# Patient Record
Sex: Male | Born: 1950 | Race: White | Hispanic: No | Marital: Married | Smoking: Former smoker
Health system: Southern US, Community
[De-identification: ages and names within clinical notes are randomized; demographics above are authoritative.]

## PROBLEM LIST (undated history)

## (undated) DIAGNOSIS — N400 Enlarged prostate without lower urinary tract symptoms: Secondary | ICD-10-CM

## (undated) DIAGNOSIS — I10 Essential (primary) hypertension: Secondary | ICD-10-CM

## (undated) HISTORY — PX: CHOLECYSTECTOMY: SHX55

---

## 2002-07-05 ENCOUNTER — Encounter: Admission: RE | Admit: 2002-07-05 | Discharge: 2002-07-05 | Payer: Self-pay | Admitting: Family Medicine

## 2002-07-05 ENCOUNTER — Encounter: Payer: Self-pay | Admitting: Family Medicine

## 2002-10-06 ENCOUNTER — Ambulatory Visit (HOSPITAL_COMMUNITY): Admission: RE | Admit: 2002-10-06 | Discharge: 2002-10-06 | Payer: Self-pay | Admitting: Gastroenterology

## 2014-03-24 ENCOUNTER — Inpatient Hospital Stay (HOSPITAL_BASED_OUTPATIENT_CLINIC_OR_DEPARTMENT_OTHER)
Admission: EM | Admit: 2014-03-24 | Discharge: 2014-03-26 | DRG: 603 | Disposition: A | Discharge status: Home / Self Care | Payer: BLUE CROSS/BLUE SHIELD | Attending: Internal Medicine | Admitting: Internal Medicine

## 2014-03-24 ENCOUNTER — Emergency Department (HOSPITAL_BASED_OUTPATIENT_CLINIC_OR_DEPARTMENT_OTHER): Payer: BLUE CROSS/BLUE SHIELD

## 2014-03-24 ENCOUNTER — Encounter (HOSPITAL_BASED_OUTPATIENT_CLINIC_OR_DEPARTMENT_OTHER): Payer: Self-pay | Admitting: *Deleted

## 2014-03-24 DIAGNOSIS — W5501XD Bitten by cat, subsequent encounter: Secondary | ICD-10-CM

## 2014-03-24 DIAGNOSIS — Z203 Contact with and (suspected) exposure to rabies: Secondary | ICD-10-CM | POA: Diagnosis present

## 2014-03-24 DIAGNOSIS — L03113 Cellulitis of right upper limb: Secondary | ICD-10-CM

## 2014-03-24 DIAGNOSIS — I10 Essential (primary) hypertension: Secondary | ICD-10-CM | POA: Diagnosis present

## 2014-03-24 DIAGNOSIS — Z79899 Other long term (current) drug therapy: Secondary | ICD-10-CM

## 2014-03-24 DIAGNOSIS — I891 Lymphangitis: Secondary | ICD-10-CM

## 2014-03-24 DIAGNOSIS — S61459A Open bite of unspecified hand, initial encounter: Secondary | ICD-10-CM | POA: Diagnosis present

## 2014-03-24 DIAGNOSIS — W5501XA Bitten by cat, initial encounter: Secondary | ICD-10-CM | POA: Diagnosis present

## 2014-03-24 DIAGNOSIS — F17290 Nicotine dependence, other tobacco product, uncomplicated: Secondary | ICD-10-CM | POA: Diagnosis present

## 2014-03-24 DIAGNOSIS — L0390 Cellulitis, unspecified: Secondary | ICD-10-CM | POA: Diagnosis present

## 2014-03-24 HISTORY — DX: Essential (primary) hypertension: I10

## 2014-03-24 LAB — CBC WITH DIFFERENTIAL/PLATELET
Basophils Absolute: 0 10*3/uL (ref 0.0–0.1)
Basophils Relative: 0 % (ref 0–1)
EOS ABS: 0.1 10*3/uL (ref 0.0–0.7)
Eosinophils Relative: 1 % (ref 0–5)
HEMATOCRIT: 45.8 % (ref 39.0–52.0)
Hemoglobin: 15.9 g/dL (ref 13.0–17.0)
LYMPHS ABS: 1.8 10*3/uL (ref 0.7–4.0)
LYMPHS PCT: 20 % (ref 12–46)
MCH: 31.7 pg (ref 26.0–34.0)
MCHC: 34.7 g/dL (ref 30.0–36.0)
MCV: 91.2 fL (ref 78.0–100.0)
MONO ABS: 0.5 10*3/uL (ref 0.1–1.0)
MONOS PCT: 5 % (ref 3–12)
NEUTROS PCT: 74 % (ref 43–77)
Neutro Abs: 6.7 10*3/uL (ref 1.7–7.7)
PLATELETS: 157 10*3/uL (ref 150–400)
RBC: 5.02 MIL/uL (ref 4.22–5.81)
RDW: 12.6 % (ref 11.5–15.5)
WBC: 9 10*3/uL (ref 4.0–10.5)

## 2014-03-24 LAB — BASIC METABOLIC PANEL
Anion gap: 8 (ref 5–15)
BUN: 13 mg/dL (ref 6–23)
CALCIUM: 9.1 mg/dL (ref 8.4–10.5)
CO2: 27 mmol/L (ref 19–32)
Chloride: 104 mmol/L (ref 96–112)
Creatinine, Ser: 1.2 mg/dL (ref 0.50–1.35)
GFR calc Af Amer: 74 mL/min — ABNORMAL LOW (ref >90)
GFR calc non Af Amer: 64 mL/min — ABNORMAL LOW (ref >90)
GLUCOSE: 183 mg/dL — AB (ref 70–99)
Potassium: 3.7 mmol/L (ref 3.5–5.1)
Sodium: 139 mmol/L (ref 135–145)

## 2014-03-24 MED ORDER — RABIES VACCINE, PCEC IM SUSR
1.0000 mL | Freq: Once | INTRAMUSCULAR | Status: AC
  Administered 2014-03-24: 1 mL via INTRAMUSCULAR
  Filled 2014-03-24: qty 1

## 2014-03-24 MED ORDER — VANCOMYCIN HCL IN DEXTROSE 1-5 GM/200ML-% IV SOLN
1000.0000 mg | Freq: Once | INTRAVENOUS | Status: AC
  Administered 2014-03-24: 1000 mg via INTRAVENOUS
  Filled 2014-03-24: qty 200

## 2014-03-24 MED ORDER — SODIUM CHLORIDE 0.9 % IV SOLN
3.0000 g | Freq: Once | INTRAVENOUS | Status: AC
  Administered 2014-03-24: 3 g via INTRAVENOUS
  Filled 2014-03-24: qty 3

## 2014-03-24 MED ORDER — SODIUM CHLORIDE 0.9 % IV SOLN
INTRAVENOUS | Status: DC
  Administered 2014-03-24: 75 mL/h via INTRAVENOUS

## 2014-03-24 MED ORDER — RABIES IMMUNE GLOBULIN 150 UNIT/ML IM INJ
20.0000 [IU]/kg | INJECTION | Freq: Once | INTRAMUSCULAR | Status: AC
  Administered 2014-03-24: 1800 [IU] via INTRAMUSCULAR
  Filled 2014-03-24: qty 12

## 2014-03-24 NOTE — ED Notes (Signed)
Pt alert, NAD, calm, interactive, resps e/u, speaking in clear complete sentences. Report some hand pain, (denies: sob, nvd, fever, dizziness or itching), ambulatory to xray with steady gait. abx infusing.

## 2014-03-24 NOTE — ED Notes (Signed)
Tolerated injections well. Up to b/r, steady gait. vanc infusing.

## 2014-03-24 NOTE — ED Notes (Signed)
C/o cat bite to back of right hand on Thursday night. Was seen by MD and prescribed antibiotics. Today hand is swollen and red. Puncture wound noted to back of head. Stray cat unknown if cat has had shots.

## 2014-03-24 NOTE — ED Notes (Signed)
Pt was bit by stray cat on Thursday evening around 1830-1900.  PMD put pt on antibiotic and given shot.  Pt seen again by PMD for recheckand advised to come to ED for rabies vaccination.  Pt right hand was bitten and pt's right arm is red and warm to touch.  Pt denies known fever at home.

## 2014-03-24 NOTE — ED Notes (Signed)
Back from xray, no changes.  Dr.Rancour in to speak with pt.

## 2014-03-24 NOTE — ED Provider Notes (Signed)
CSN: [id number]     Arrival date & time 03/24/14  1712 History  This chart was scribed for Douglas OctaveStephen Mario Coronado, MD by Tonye RoyaltyJoshua Chen, ED Scribe. This patient was seen in room MH06/MH06 and the patient's care was started at 6:21 PM.    Chief Complaint  Patient presents with  . Animal Bite   The history is provided by the patient. No language interpreter was used.    HPI Comments: Douglas Montgomery is a 64 y.o. male who presents to the Emergency Department complaining of cat bite to back of right hand 2 nights ago. He does not know the cat, who it might belong to, or if it had shots. [facility] and was started on antibiotics 2 days ago. He notes redness to dorsal hand and going up to his upper arm which he states has improved, but was referred here from doctor's office for rabies shot. He states pain has also improved. He reports slight associated fever. He has history of HTN. He denies history of DM. He states his tetanus shot is up to date. He denies vomiting, chest pain, or abdominal pain.  Past Medical History  Diagnosis Date  . Hypertension    History reviewed. No pertinent past surgical history. No family history on file. History  Substance Use Topics  . Smoking status: Current Every Day Smoker -- 0.30 packs/day    Types: Cigars  . Smokeless tobacco: Not on file  . Alcohol Use: Not on file    Review of Systems A complete 10 system review of systems was obtained and all systems are negative except as noted in the HPI and PMH.    Allergies  Review of patient's allergies indicates no known allergies.  Home Medications   Prior to Admission medications   Medication Sig Start Date End Date Taking? Authorizing Provider  nebivolol (BYSTOLIC) 2.5 MG tablet Take 2.5 mg by mouth daily.   Yes Historical Provider, MD   BP 135/79 mmHg  Pulse 65  Temp(Src) 97.9 F (36.6 C) (Oral)  Resp 18  Ht 5\' 7"  (1.702 m)  Wt 196 lb (88.905 kg)  BMI 30.69 kg/m2  SpO2  99% Physical Exam  Constitutional: He is oriented to person, place, and time. He appears well-developed and well-nourished. No distress.  HENT:  Head: Normocephalic and atraumatic.  Mouth/Throat: Oropharynx is clear and moist. No oropharyngeal exudate.  Eyes: Conjunctivae and EOM are normal. Pupils are equal, round, and reactive to light.  Neck: Normal range of motion. Neck supple.  No meningismus.  Cardiovascular: Normal rate, regular rhythm, normal heart sounds and intact distal pulses.   No murmur heard. Pulmonary/Chest: Effort normal and breath sounds normal. No respiratory distress.  Abdominal: Soft. There is no tenderness. There is no rebound and no guarding.  Musculoskeletal: Normal range of motion. He exhibits no edema or tenderness.  Diffuse erythema to dorsal right hand 2 overlying puncture wounds Full ROM of fingers Streaky erythema up forearm to mid upper arm intact radial pulse  Neurological: He is alert and oriented to person, place, and time. No cranial nerve deficit. He exhibits normal muscle tone. Coordination normal.  No ataxia on finger to nose bilaterally. No pronator drift. 5/5 strength throughout. CN 2-12 intact. Negative Romberg. Equal grip strength. Sensation intact. Gait is normal.   Skin: Skin is warm.  Psychiatric: He has a normal mood and affect. His behavior is normal.  Nursing note and vitals reviewed.   ED Course  Procedures (including critical care time)  DIAGNOSTIC STUDIES: Oxygen Saturation is 98% on room air, normal by my interpretation.    COORDINATION OF CARE: 6:27 PM Discussed treatment plan with patient at beside, the patient agrees with the plan and has no further questions at this time.  8:07 PM Discussed treatment plan of admission for antibiotics, he will make phone call and decide.  Labs Review Labs Reviewed  BASIC METABOLIC PANEL - Abnormal; Notable for the following:    Glucose, Bld 183 (*)    GFR calc non Af Amer 64 (*)    GFR  calc Af Amer 74 (*)    All other components within normal limits  CULTURE, BLOOD (ROUTINE X 2)  CULTURE, BLOOD (ROUTINE X 2)  CBC WITH DIFFERENTIAL/PLATELET    Imaging Review Dg Hand Complete Right  03/24/2014   CLINICAL DATA:  CAT bite, pain and redness and swelling.  EXAM: RIGHT HAND - COMPLETE 3+ VIEW  COMPARISON:  None.  FINDINGS: Soft tissue swelling along the dorsum of the hand at the level of the metacarpals. No radiopaque foreign body. Small round calcific density along the tip of the distal phalanx third digit may be incidental or sequelae of remote trauma. Mild angulation of the fifth metacarpal may reflect a remote fracture. No displaced acute fracture or dislocation. No aggressive osseous lesion.  IMPRESSION: Dorsal soft tissue swelling of the right hand. No acute osseous finding.   Electronically Signed   By: Jearld Lesch M.D.   On: 03/24/2014 20:34     EKG Interpretation None      MDM   Final diagnoses:  Cellulitis of hand, right  Lymphangitis  Cat bite, subsequent encounter   Right hand bitten by stray cat 2 nights ago. Given unknown antibiotic by PCP. Seen for recheck today and told to come to ED if the for rabies vaccine.  Patient with erythema and edema to the right dorsal hand. Spreading erythema of forearm and upper arm. No fever.  No leukocytosis. Patient given rabies vaccine and immunoglobulin. Tetanus up to date.  X-ray negative for fracture. No clinical abscess. Patient saline [place] appears to be worsening despite outpatient treatment. We'll admit for IV antibiotics. Given vancomycin and Unasyn in the ED. Discussed with Dr. Lovell Sheehan.  I personally performed the services described in this documentation, which was scribed in my presence. The recorded information has been reviewed and is accurate.   Douglas Octave, MD 03/24/14 2126

## 2014-03-25 DIAGNOSIS — S61459A Open bite of unspecified hand, initial encounter: Secondary | ICD-10-CM | POA: Diagnosis present

## 2014-03-25 DIAGNOSIS — W5501XA Bitten by cat, initial encounter: Secondary | ICD-10-CM | POA: Diagnosis present

## 2014-03-25 DIAGNOSIS — I10 Essential (primary) hypertension: Secondary | ICD-10-CM | POA: Diagnosis present

## 2014-03-25 DIAGNOSIS — Z203 Contact with and (suspected) exposure to rabies: Secondary | ICD-10-CM | POA: Diagnosis present

## 2014-03-25 DIAGNOSIS — I891 Lymphangitis: Secondary | ICD-10-CM | POA: Diagnosis present

## 2014-03-25 DIAGNOSIS — F17290 Nicotine dependence, other tobacco product, uncomplicated: Secondary | ICD-10-CM | POA: Diagnosis present

## 2014-03-25 DIAGNOSIS — Z79899 Other long term (current) drug therapy: Secondary | ICD-10-CM | POA: Diagnosis not present

## 2014-03-25 DIAGNOSIS — L03113 Cellulitis of right upper limb: Principal | ICD-10-CM

## 2014-03-25 DIAGNOSIS — S61451A Open bite of right hand, initial encounter: Secondary | ICD-10-CM

## 2014-03-25 LAB — COMPREHENSIVE METABOLIC PANEL
ALT: 45 U/L (ref 0–53)
ANION GAP: 4 — AB (ref 5–15)
AST: 37 U/L (ref 0–37)
Albumin: 3.4 g/dL — ABNORMAL LOW (ref 3.5–5.2)
Alkaline Phosphatase: 83 U/L (ref 39–117)
BUN: 7 mg/dL (ref 6–23)
CALCIUM: 8.6 mg/dL (ref 8.4–10.5)
CO2: 27 mmol/L (ref 19–32)
Chloride: 107 mmol/L (ref 96–112)
Creatinine, Ser: 1.06 mg/dL (ref 0.50–1.35)
GFR calc non Af Amer: 74 mL/min — ABNORMAL LOW (ref >90)
GFR, EST AFRICAN AMERICAN: 86 mL/min — AB (ref >90)
Glucose, Bld: 99 mg/dL (ref 70–99)
POTASSIUM: 3.6 mmol/L (ref 3.5–5.1)
SODIUM: 138 mmol/L (ref 135–145)
Total Bilirubin: 1.2 mg/dL (ref 0.3–1.2)
Total Protein: 5.7 g/dL — ABNORMAL LOW (ref 6.0–8.3)

## 2014-03-25 LAB — CBC
HCT: 42 % (ref 39.0–52.0)
Hemoglobin: 14.2 g/dL (ref 13.0–17.0)
MCH: 31 pg (ref 26.0–34.0)
MCHC: 33.8 g/dL (ref 30.0–36.0)
MCV: 91.7 fL (ref 78.0–100.0)
Platelets: 139 10*3/uL — ABNORMAL LOW (ref 150–400)
RBC: 4.58 MIL/uL (ref 4.22–5.81)
RDW: 12.6 % (ref 11.5–15.5)
WBC: 6.9 10*3/uL (ref 4.0–10.5)

## 2014-03-25 LAB — SEDIMENTATION RATE: Sed Rate: 3 mm/hr (ref 0–16)

## 2014-03-25 LAB — PROTIME-INR
INR: 1.06 (ref 0.00–1.49)
Prothrombin Time: 13.9 seconds (ref 11.6–15.2)

## 2014-03-25 MED ORDER — ONDANSETRON HCL 4 MG PO TABS: 4.0000 mg | ORAL_TABLET | Freq: Four times a day (QID) | ORAL | Status: DC | PRN

## 2014-03-25 MED ORDER — SODIUM CHLORIDE 0.9 % IV SOLN
3.0000 g | Freq: Four times a day (QID) | INTRAVENOUS | Status: DC
  Administered 2014-03-25 – 2014-03-26 (×5): 3 g via INTRAVENOUS
  Filled 2014-03-25 (×8): qty 3

## 2014-03-25 MED ORDER — NEBIVOLOL HCL 2.5 MG PO TABS
2.5000 mg | ORAL_TABLET | Freq: Every day | ORAL | Status: DC
  Administered 2014-03-25 – 2014-03-26 (×2): 2.5 mg via ORAL
  Filled 2014-03-25 (×2): qty 1

## 2014-03-25 MED ORDER — ONDANSETRON HCL 4 MG/2ML IJ SOLN: 4.0000 mg | Freq: Four times a day (QID) | INTRAMUSCULAR | Status: DC | PRN

## 2014-03-25 MED ORDER — VANCOMYCIN HCL IN DEXTROSE 1-5 GM/200ML-% IV SOLN
1000.0000 mg | Freq: Three times a day (TID) | INTRAVENOUS | Status: DC
  Administered 2014-03-25 – 2014-03-26 (×4): 1000 mg via INTRAVENOUS
  Filled 2014-03-25 (×6): qty 200

## 2014-03-25 MED ORDER — ACETAMINOPHEN 325 MG PO TABS: 650.0000 mg | ORAL_TABLET | Freq: Four times a day (QID) | ORAL | Status: DC | PRN

## 2014-03-25 MED ORDER — ACETAMINOPHEN 650 MG RE SUPP: 650.0000 mg | Freq: Four times a day (QID) | RECTAL | Status: DC | PRN

## 2014-03-25 MED ORDER — ENOXAPARIN SODIUM 40 MG/0.4ML ~~LOC~~ SOLN
40.0000 mg | Freq: Every day | SUBCUTANEOUS | Status: DC
  Filled 2014-03-25 (×2): qty 0.4

## 2014-03-25 NOTE — ED Notes (Signed)
No changes, VSS, carelink here to transport pt, [facility]

## 2014-03-25 NOTE — Progress Notes (Signed)
Report received from [place] Place[place], [facility]. Awaiting pt's arrival.

## 2014-03-25 NOTE — Progress Notes (Addendum)
NURSING PROGRESS NOTE  Milas Kocherhillip A Micallef [id number] Admission Data: 03/25/2014 1:41 AM Attending Provider: Ron ParkerHarvette C Jenkins, MD PCP: Duane Lopeoss, Alan, MD Code Status:    Charlestine Nighthillip A Maybury is a 64 y.o. male patient admitted from [facility] :  -No acute distress noted.  -No complaints of shortness of breath.  -No complaints of chest pain.    Blood pressure 130/83, pulse 66, temperature 97.7 F (36.5 C), temperature source Oral, resp. rate 18, height 5\' 7"  (1.702 m), weight 80.7 kg (177 lb 14.6 oz), SpO2 97 %.   IV Fluids:  IV in place, occlusive dsg intact without redness, [facility] Allergies:  Review of patient's allergies indicates no known allergies.  Past Medical History:   has a past medical history of Hypertension.  Past Surgical History:   has no past surgical history on file.  Social History:   reports that he has been smoking Cigars.  He does not have any smokeless tobacco history on file.  Skin: cellulitis in Rt hand   Patient/Family orientated to room. Information packet given to patient/family. Admission inpatient armband information verified with patient/family to include name and date of birth and placed on patient arm. Side rails up x 2, fall assessment and education completed with patient/family. Patient/family able to verbalize understanding of risk associated with falls and verbalized understanding to call for assistance before getting out of bed. Call light within reach. Patient/family able to voice and demonstrate understanding of unit orientation instructions.

## 2014-03-25 NOTE — H&P (Signed)
[facility] History and Physical  Patient: Douglas Montgomery  MRN: [id number]  DOB: 02-10-1950  DOS: the patient was seen and examined on 03/25/2014 PCP:  Melinda Crutch, MD  Chief Complaint: Cat bite  HPI: Douglas Montgomery is a 64 y.o. male with Past medical history of hypertension. The patient presented with complaints of a cat bite that occurred on Thursday. The patient was petting the cat and the cat was not a pet, and its vaccinations status was not verified. Patient went to his PCP who gave him 1 injection and one antibiotic which patient was taking twice a day and has taken 2 doses. Patient is unaware of the names. He went to see his PCP again for the follow-up and was sent to ER for rabies shot. Patient mentions that the redness and the swelling has mildly worsened over time. Patient denies any fever or chills chest pain shortness of breath nausea vomiting diarrhea or burning urination any swelling or redness anywhere else.  The patient is coming from home. And at his baseline independent for most of his ADL.  Review of Systems: as mentioned in the history of present illness.  A Comprehensive review of the other systems is negative.  Past Medical History  Diagnosis Date  . Hypertension    History reviewed. No pertinent past surgical history. Social History:  reports that he has been smoking Cigars.  He does not have any smokeless tobacco history on file. His alcohol and drug histories are not on file.  No Known Allergies  No family history on file.  Prior to Admission medications   Medication Sig Start Date End Date Taking? Authorizing Provider  nebivolol (BYSTOLIC) 2.5 MG tablet Take 2.5 mg by mouth daily.   Yes Historical Provider, MD    Physical Exam: Filed Vitals:   03/24/14 2355 03/25/14 0100 03/25/14 0458 03/25/14 0458  BP: 122/83 130/83  130/82  Pulse: 64 66  62  Temp: 97.7 F (36.5 C) 97.7 F (36.5 C)  99 F (37.2 C)  TempSrc: Oral Oral  Oral  Resp: _0 Height:  _1  (1.702 m)    Weight:  80.7 kg (177 lb 14.6 oz) 80.7 kg (177 lb 14.6 oz)   SpO2: 98% 97%  97%    General: Alert, Awake and Oriented to Time, Place and Person. Appear in mild distress Eyes: PERRL ENT: Oral Mucosa clear moist. Neck: no JVD Cardiovascular: S1 and S2 Present, no Murmur, Peripheral Pulses Present Respiratory: Bilateral Air entry equal and Decreased, Clear to Auscultation, noCrackles, no wheezes Abdomen: Bowel Sound present, Soft and non tender Skin: no Rash Extremities: no Pedal edema, no calf tenderness Right upper extremity swollen with redness and mild warmth. Range of motion of fingers unaffected. Range of motion at rest is minimally affected No significant tenderness. No fluctuant abscess.  Neurologic: Grossly no focal neuro deficit.  Labs on Admission:  CBC:  Recent Labs Lab 03/24/14 1830  WBC 9.0  NEUTROABS 6.7  HGB 15.9  HCT 45.8  MCV 91.2  PLT 157    CMP     Component Value Date/Time   NA 139 03/24/2014 1830   K 3.7 03/24/2014 1830   CL 104 03/24/2014 1830   CO2 27 03/24/2014 1830   GLUCOSE 183* 03/24/2014 1830   BUN 13 03/24/2014 1830   CREATININE 1.20 03/24/2014 1830   CALCIUM 9.1 03/24/2014 1830   GFRNONAA 64* 03/24/2014 1830   GFRAA 74* 03/24/2014 1830    No  results for input(s): LIPASE, AMYLASE in the last 168 hours.  No results for input(s): CKTOTAL, CKMB, CKMBINDEX, TROPONINI in the last 168 hours. BNP (last 3 results) No results for input(s): BNP in the last 8760 hours.  ProBNP (last 3 results) No results for input(s): PROBNP in the last 8760 hours.   Radiological Exams on Admission: Dg Hand Complete Right  03/24/2014   CLINICAL DATA:  CAT bite, pain and redness and swelling.  EXAM: RIGHT HAND - COMPLETE 3+ VIEW  COMPARISON:  None.  FINDINGS: Soft tissue swelling along the dorsum of the hand at the level of the metacarpals. No radiopaque foreign body. Small round calcific density along the tip of the distal  phalanx third digit may be incidental or sequelae of remote trauma. Mild angulation of the fifth metacarpal may reflect a remote fracture. No displaced acute fracture or dislocation. No aggressive osseous lesion.  IMPRESSION: Dorsal soft tissue swelling of the right hand. No acute osseous finding.   Electronically Signed   By: Carlos Levering M.D.   On: 03/24/2014 20:34    Assessment/Plan Principal Problem:   Cat bite of hand Active Problems:   Cellulitis   Essential hypertension   1. Cat bite of hand The patient is presenting with a cat bite. The impression is progressively worsening. Patient has been on outpatient antibiotics for one day. Patient has received one injection by his PCP. Current recommendation would be to get a tetanus booster for the patient, which the patient mentions he has received 3-4 years ago. Recommendation would also include rabies vaccination. Patient has received rabies immune globulin 20 mL per KG yesterday along with the rabies vaccine. Patient will continue to get further rabies vaccine at 3, 7 and 14 day interval from 03/24/2014 to complete the course. Patient is also being placed on vancomycin and Unasyn to cover him for MRSA from skin. ESR and CRP in the morning. Recommended hand elevation. If the swelling is further worsening patient may require consultation with hand surgery. We will need to verify from the PCP of whether the patient has received tetanus vaccine or not.  2. Essential hypertension. Continue home medication.  Advance goals of care discussion: Full code   DVT Prophylaxis: subcutaneous Heparin. Nutrition: Regular diet  Family Communication: Family was present at bedside, opportunity was given to ask question and all questions were answered satisfactorily at the time of interview. Disposition: Admitted to inpatient in med-surge unit.  Author: Berle Mull, MD [facility] Pager: [phone] 03/25/2014, 6:19 AM    If  7PM-7AM, please contact night-coverage [email or web address] Password [email or web address]

## 2014-03-25 NOTE — Progress Notes (Addendum)
Pt seen and examined at bedside. Admitted for management of cat bite. Started on Unasyn and Vancomycin. Please see detailed H&P by Dr. Royetta CrochetPate. Agree with ABX choice. Keep on same regimen and reassess in AM. Possible d/c in AM if pt continues doing well.   Debbora PrestoMAGICK-Anastasha Ortez, MD  [facility] Pager [phone] Cell [phone]  If 7PM-7AM, please contact night-coverage [email or web address] Password [email or web address]

## 2014-03-25 NOTE — Progress Notes (Signed)
ANTIBIOTIC CONSULT NOTE - INITIAL  Pharmacy Consult for Vancomycin and Unasyn Indication: cellulitis  No Known Allergies  Patient Measurements: Height: 5\' 7"  (170.2 cm) Weight: 177 lb 14.6 oz (80.7 kg) IBW/kg (Calculated) : 66.1  Vital Signs: Temp: 97.7 F (36.5 C) (03/20 0100) Temp Source: Oral (03/20 0100) BP: 130/83 mmHg (03/20 0100) Pulse Rate: 66 (03/20 0100) Intake/Output from previous day: 03/19 0701 - 03/20 0700 In: 540 [P.O.:240; IV Piggyback:300] Out: -  Intake/Output from this shift: Total I/O In: 540 [P.O.:240; IV Piggyback:300] Out: -   Labs:  Recent Labs  03/24/14 1830  WBC 9.0  HGB 15.9  PLT 157  CREATININE 1.20   Estimated Creatinine Clearance: 65.7 mL/min (by C-G formula based on Cr of 1.2). No results for input(s): VANCOTROUGH, VANCOPEAK, VANCORANDOM, GENTTROUGH, GENTPEAK, GENTRANDOM, TOBRATROUGH, TOBRAPEAK, TOBRARND, AMIKACINPEAK, AMIKACINTROU, AMIKACIN in the last 72 hours.   Microbiology: No results found for this or any previous visit (from the past 720 hour(s)).  Medical History: Past Medical History  Diagnosis Date  . Hypertension     Medications:  Prescriptions prior to admission  Medication Sig Dispense Refill Last Dose  . nebivolol (BYSTOLIC) 2.5 MG tablet Take 2.5 mg by mouth daily.      Assessment: 64 y.o. male with animal bite for empiric antibiotics.  Vancomycin 1 g IV given in ED at 2100  Goal of Therapy:  Vancomycin trough level 10-15 mcg/ml  Plan:  Vancomycin 1 g IV q8h Unasyn 3 g IV q6h  Jennise Both, Gary FleetGregory Vernon 03/25/2014,2:13 AM

## 2014-03-26 LAB — C-REACTIVE PROTEIN: CRP: 5.6 mg/dL — AB (ref <0.60)

## 2014-03-26 MED ORDER — AMOXICILLIN-POT CLAVULANATE 875-125 MG PO TABS: 1.0000 | ORAL_TABLET | Freq: Two times a day (BID) | ORAL | Status: DC

## 2014-03-26 NOTE — Discharge Summary (Signed)
Physician Discharge Summary  Douglas Montgomery WUJ:[id number] DOB: 11/16/1950 DOA: 03/24/2014  PCP:  Duane Lopeoss, Alan, MD  Admit date: 03/24/2014 Discharge date: 03/26/2014  Recommendations for Outpatient Follow-up:  1. Pt will need to follow up with PCP in 2-3 weeks post discharge 2. Augmentin upon discharge   Discharge Diagnoses:  Principal Problem:   Cat bite of hand Active Problems:   Cellulitis   Essential hypertension  Discharge Condition: Stable  Diet recommendation: Heart healthy diet discussed in details   History of present illness:  64 y.o. male with HTN, presented for evaluation of erythema in the right hand after cat bite.   Hospital Course:  Principal Problem:   Cat bite of hand - continue Augmentin upon discharge - pt doing better and wants to go home - erythema and pain is much better this AM Active Problems:   Cellulitis - improving and will continue Augmentin as noted above    Essential hypertension - reasonable inpatient control   Procedures/Studies: Dg Hand Complete Right  03/24/2014   Dorsal soft tissue swelling of the right hand. No acute osseous finding.     Consultations:  None   Antibiotics:  Augmentin upon discharge   Discharge Exam: Filed Vitals:   03/26/14 0623  BP: 113/72  Pulse: 55  Temp: 98.2 F (36.8 C)  Resp: 19   Filed Vitals:   03/25/14 0458 03/25/14 1410 03/25/14 2238 03/26/14 0623  BP: 130/82 114/98 110/62 113/72  Pulse: 62 77 62 55  Temp: 99 F (37.2 C) 98.2 F (36.8 C) 98.7 F (37.1 C) 98.2 F (36.8 C)  TempSrc: Oral Oral Oral Oral  Resp: 13 16 18 19   Height:      Weight:    81.9 kg (180 lb 8.9 oz)  SpO2: 97% 96% 98% 97%    General: Pt is alert, follows commands appropriately, not in acute distress Cardiovascular: Regular rate and rhythm, no rubs, no gallops Respiratory: Clear to auscultation bilaterally, no wheezing, no crackles, no rhonchi Abdominal: Soft, non tender, non distended, bowel sounds +, no  guarding Extremities: right hand erythema and edema improved, no cyanosis, pulses palpable bilaterally DP and PT Neuro: Grossly nonfocal  Discharge Instructions  Discharge Instructions    Diet - low sodium heart healthy    Complete by:  As directed      Increase activity slowly    Complete by:  As directed             Medication List    TAKE these medications        amoxicillin-clavulanate 875-125 MG per tablet  Commonly known as:  AUGMENTIN  Take 1 tablet by mouth 2 (two) times daily.     nebivolol 2.5 MG tablet  Commonly known as:  BYSTOLIC  Take 2.5 mg by mouth daily.           Follow-up Information    Follow up with Debbora PrestoMAGICK-Cristen Murcia, MD.   Specialty:  Internal Medicine   Why:  As needed, call my cell phone [phone]   Contact information:   [address] [place] [address] [phone]        The results of significant diagnostics from this hospitalization (including imaging, microbiology, ancillary and laboratory) are listed below for reference.     Microbiology: Recent Results (from the past 240 hour(s))  Blood culture (routine x 2)     Status: None (Preliminary result)   Collection Time: 03/24/14  6:45 PM  Result Value Ref Range  Status   Specimen Description BLOOD RIGHT ARM  Final   Special Requests   Final    BOTTLES DRAWN AEROBIC AND ANAEROBIC ANA 5CC AER 10CC   Culture   Final           BLOOD CULTURE RECEIVED NO GROWTH TO DATE CULTURE WILL BE HELD FOR 5 DAYS BEFORE ISSUING A FINAL NEGATIVE REPORT Performed at [facility]    Report Status PENDING  Incomplete  Blood culture (routine x 2)     Status: None (Preliminary result)   Collection Time: 03/24/14  7:30 PM  Result Value Ref Range Status   Specimen Description BLOOD LEFT ARM  Final   Special Requests   Final    BOTTLES DRAWN AEROBIC AND ANAEROBIC AER 5CC ANA 5CC   Culture   Final           BLOOD CULTURE RECEIVED NO GROWTH TO DATE CULTURE WILL BE HELD  FOR 5 DAYS BEFORE ISSUING A FINAL NEGATIVE REPORT Performed at [facility]    Report Status PENDING  Incomplete     Labs: Basic Metabolic Panel:  Recent Labs Lab 03/24/14 1830 03/25/14 0648  NA 139 138  K 3.7 3.6  CL 104 107  CO2 27 27  GLUCOSE 183* 99  BUN 13 7  CREATININE 1.20 1.06  CALCIUM 9.1 8.6   Liver Function Tests:  Recent Labs Lab 03/25/14 0648  AST 37  ALT 45  ALKPHOS 83  BILITOT 1.2  PROT 5.7*  ALBUMIN 3.4*   CBC:  Recent Labs Lab 03/24/14 1830 03/25/14 0648  WBC 9.0 6.9  NEUTROABS 6.7  --   HGB 15.9 14.2  HCT 45.8 42.0  MCV 91.2 91.7  PLT 157 139*     SIGNED: Time coordinating discharge: Over 30 minutes  Debbora Presto, MD  [facility] 03/26/2014, 10:18 AM Pager [phone]  If 7PM-7AM, please contact night-coverage [email or web address] Password [email or web address]

## 2014-03-26 NOTE — Discharge Instructions (Signed)
Continue to get further rabies vaccine at 3, 7 and 14 day interval from 03/24/2014 to complete the course.  Next vaccination due on March 22nd, March 26th, April 2nd.  Animal Bite An animal bite can result in a scratch on the skin, deep open cut, puncture of the skin, crush injury, or tearing away of the skin or a body part. Dogs are responsible for most animal bites. Children are bitten more often than adults. An animal bite can range from very mild to more serious. A small bite from your house pet is no cause for alarm. However, some animal bites can become infected or injure a bone or other tissue. You must seek medical care if:  The skin is broken and bleeding does not slow down or stop after 15 minutes.  The puncture is deep and difficult to clean (such as a cat bite).  Pain, warmth, redness, or pus develops around the wound.  The bite is from a stray animal or rodent. There may be a risk of rabies infection.  The bite is from a snake, raccoon, skunk, fox, coyote, or bat. There may be a risk of rabies infection.  The person bitten has a chronic illness such as diabetes, liver disease, or cancer, or the person takes medicine that lowers the immune system.  There is concern about the location and severity of the bite. It is important to clean and protect an animal bite wound right away to prevent infection. Follow these steps:  Clean the wound with plenty of water and soap.  Apply an antibiotic cream.  Apply gentle pressure over the wound with a clean towel or gauze to slow or stop bleeding.  Elevate the affected area above the heart to help stop any bleeding.  Seek medical care. Getting medical care within 8 hours of the animal bite leads to the best possible outcome. DIAGNOSIS  Your caregiver will most likely:  Take a detailed history of the animal and the bite injury.  Perform a wound exam.  Take your medical history. Blood tests or X-rays may be performed. Sometimes,  infected bite wounds are cultured and sent to a lab to identify the infectious bacteria.  TREATMENT  Medical treatment will depend on the location and type of animal bite as well as the patient's medical history. Treatment may include:  Wound care, such as cleaning and flushing the wound with saline solution, bandaging, and elevating the affected area.  Antibiotics.  Tetanus immunization.  Rabies immunization.  Leaving the wound open to heal. This is often done with animal bites, due to the high risk of infection. However, in certain cases, wound closure with stitches, wound adhesive, skin adhesive strips, or staples may be used. Infected bites that are left untreated may require intravenous (IV) antibiotics and surgical treatment in the hospital. HOME CARE INSTRUCTIONS  Follow your caregiver's instructions for wound care.  Take all medicines as directed.  If your caregiver prescribes antibiotics, take them as directed. Finish them even if you start to feel better.  Follow up with your caregiver for further exams or immunizations as directed. You may need a tetanus shot if:  You cannot remember when you had your last tetanus shot.  You have never had a tetanus shot.  The injury broke your skin. If you get a tetanus shot, your arm may swell, get red, and feel warm to the touch. This is common and not a problem. If you need a tetanus shot and you choose not to have  one, there is a rare chance of getting tetanus. Sickness from tetanus can be serious. SEEK MEDICAL CARE IF:  You notice warmth, redness, soreness, swelling, pus discharge, or a bad smell coming from the wound.  You have a red line on the skin coming from the wound.  You have a fever, chills, or a general ill feeling.  You have nausea or vomiting.  You have continued or worsening pain.  You have trouble moving the injured part.  You have other questions or concerns. MAKE SURE YOU:  Understand these  instructions.  Will watch your condition.  Will get help right away if you are not doing well or get worse. Document Released: 09/09/2010 Document Revised: 03/16/2011 Document Reviewed: 09/09/2010 [facility] Patient Information 2015 [place]. This information is not intended to replace advice given to you by your health care provider. Make sure you discuss any questions you have with your health care provider.

## 2014-03-26 NOTE — Progress Notes (Signed)
Patient d/c home.  Education and follow up care discussed.  [facility] and with primary care provider

## 2014-03-27 ENCOUNTER — Emergency Department (INDEPENDENT_AMBULATORY_CARE_PROVIDER_SITE_OTHER)
Admission: EM | Admit: 2014-03-27 | Discharge: 2014-03-27 | Disposition: A | Discharge status: Home / Self Care | Payer: BLUE CROSS/BLUE SHIELD | Source: Home / Self Care

## 2014-03-27 ENCOUNTER — Encounter: Payer: Self-pay | Admitting: *Deleted

## 2014-03-27 DIAGNOSIS — Z203 Contact with and (suspected) exposure to rabies: Secondary | ICD-10-CM | POA: Diagnosis not present

## 2014-03-27 MED ORDER — RABIES VACCINE, PCEC IM SUSR
1.0000 mL | Freq: Once | INTRAMUSCULAR | Status: AC
  Administered 2014-03-27: 1 mL via INTRAMUSCULAR

## 2014-03-27 NOTE — Progress Notes (Signed)
Retro review.  Utilization review complete. Isidoro DonningAlesia Dysen Edmondson RN CCM Case Mgmt phone [phone]

## 2014-03-27 NOTE — ED Notes (Signed)
Douglas Montgomery is here today for a rabies vaccine.

## 2014-03-31 ENCOUNTER — Emergency Department (INDEPENDENT_AMBULATORY_CARE_PROVIDER_SITE_OTHER)
Admission: EM | Admit: 2014-03-31 | Discharge: 2014-03-31 | Disposition: A | Discharge status: Home / Self Care | Payer: BLUE CROSS/BLUE SHIELD | Source: Home / Self Care

## 2014-03-31 DIAGNOSIS — Z203 Contact with and (suspected) exposure to rabies: Secondary | ICD-10-CM

## 2014-03-31 LAB — CULTURE, BLOOD (ROUTINE X 2)
CULTURE: NO GROWTH
Culture: NO GROWTH

## 2014-03-31 MED ORDER — RABIES VACCINE, PCEC IM SUSR
1.0000 mL | Freq: Once | INTRAMUSCULAR | Status: AC
  Administered 2014-03-31: 1 mL via INTRAMUSCULAR

## 2014-03-31 NOTE — ED Notes (Signed)
Patient here today for his 3rd rabies, injection. Patient received RabAvert 1.0 mL IM right deltoid. Patient tolerated well.

## 2014-04-07 ENCOUNTER — Encounter: Payer: Self-pay | Admitting: Emergency Medicine

## 2014-04-07 ENCOUNTER — Emergency Department (INDEPENDENT_AMBULATORY_CARE_PROVIDER_SITE_OTHER)
Admission: EM | Admit: 2014-04-07 | Discharge: 2014-04-07 | Disposition: A | Discharge status: Home / Self Care | Payer: BLUE CROSS/BLUE SHIELD | Source: Home / Self Care

## 2014-04-07 DIAGNOSIS — Z203 Contact with and (suspected) exposure to rabies: Secondary | ICD-10-CM | POA: Diagnosis not present

## 2014-04-07 MED ORDER — RABIES VACCINE, PCEC IM SUSR
1.0000 mL | Freq: Once | INTRAMUSCULAR | Status: AC
  Administered 2014-04-07: 1 mL via INTRAMUSCULAR

## 2014-04-07 NOTE — ED Notes (Signed)
Pt here today for his final rabies vaccination.

## 2014-08-14 ENCOUNTER — Emergency Department
Admission: EM | Admit: 2014-08-14 | Discharge: 2014-08-14 | Disposition: A | Discharge status: Home / Self Care | Payer: BLUE CROSS/BLUE SHIELD | Source: Home / Self Care | Attending: Family Medicine | Admitting: Family Medicine

## 2014-08-14 DIAGNOSIS — M546 Pain in thoracic spine: Secondary | ICD-10-CM | POA: Diagnosis not present

## 2014-08-14 DIAGNOSIS — W1839XA Other fall on same level, initial encounter: Secondary | ICD-10-CM | POA: Diagnosis not present

## 2014-08-14 DIAGNOSIS — W010XXA Fall on same level from slipping, tripping and stumbling without subsequent striking against object, initial encounter: Secondary | ICD-10-CM

## 2014-08-14 DIAGNOSIS — S50811A Abrasion of right forearm, initial encounter: Secondary | ICD-10-CM | POA: Diagnosis not present

## 2014-08-14 DIAGNOSIS — S20411A Abrasion of right back wall of thorax, initial encounter: Secondary | ICD-10-CM

## 2014-08-14 NOTE — ED Notes (Signed)
Pt fell off back of track hoe last Friday, 8/5.  Cut on rt forearm and right back below shoulder

## 2014-08-14 NOTE — ED Provider Notes (Signed)
CSN: [id number]     Arrival date & time 08/14/14  1207 History   First MD Initiated Contact with Patient 08/14/14 1230     Chief Complaint  Patient presents with  . Back Pain   (Consider location/radiation/quality/duration/timing/severity/associated sxs/prior Treatment) HPI  The patient is a 65 year old male presenting to urgent care with complaints of right-sided mid back pain with an abrasion to his back and right forearm.  Patient states symptoms started after he lost his footing and fell off a backhoe 4 days ago.  Denies hitting his head or loss of consciousness.  He has been taking Aleve with minimal relief.  Right-sided back pain is achy and sore 3/10 at worst.  Patient states pain is more nagging than painful.  States his friends encouraged him to be seen by a medical provider.  Denies numbness or tingling in arms, legs.  Denies any other concerns today.  Tetanus is up-to-date.  Past Medical History  Diagnosis Date  . Hypertension    History reviewed. No pertinent past surgical history. Family History  Problem Relation Age of Onset  . Hypertension Father   . Cancer Brother    History  Substance Use Topics  . Smoking status: Current Every Day Smoker -- 0.30 packs/day    Types: Cigars  . Smokeless tobacco: Not on file  . Alcohol Use: No    Review of Systems  Respiratory: Negative for cough and shortness of breath.   Cardiovascular: Negative for chest pain and palpitations.  Gastrointestinal: Negative for nausea, vomiting, abdominal pain and diarrhea.  Musculoskeletal: Positive for myalgias and back pain (Right middle). Negative for gait problem, neck pain and neck stiffness.  Skin: Positive for wound. Negative for color change.  Neurological: Negative for weakness and numbness.    Allergies  Review of patient's allergies indicates no known allergies.  Home Medications   Prior to Admission medications   Medication Sig Start Date End Date Taking? Authorizing Provider   nebivolol (BYSTOLIC) 2.5 MG tablet Take 2.5 mg by mouth daily.    Historical Provider, MD   BP 129/90 mmHg  Pulse 75  Temp(Src) 98.5 F (36.9 C) (Oral)  Ht 5\' 9"  (1.753 m)  Wt 189 lb 8 oz (85.957 kg)  BMI 27.97 kg/m2  SpO2 97% Physical Exam  Constitutional: He is oriented to person, place, and time. He appears well-developed and well-nourished.  HENT:  Head: Normocephalic and atraumatic.  Eyes: EOM are normal.  Neck: Normal range of motion.  Cardiovascular: Normal rate.   Pulmonary/Chest: Effort normal.  Musculoskeletal: Normal range of motion. He exhibits tenderness. He exhibits no edema.  No midline spinal tenderness. FROM upper and lower extremities with 5/5 strength bilaterally.  Mild tenderness to muscles in Right side thoracic region. No crepitus.   Neurological: He is alert and oriented to person, place, and time.  Normal gait.  Skin: Skin is warm and dry.  Right forearm: 1cm superficial abrasion. No active bleeding or discharge, small scab in place. Right side thoracic back: 3cm 'U' shaped abrasion, scab in place. No active bleeding or discharge.  Psychiatric: He has a normal mood and affect. His behavior is normal.  Nursing note and vitals reviewed.   ED Course  Procedures (including critical care time) Labs Review Labs Reviewed - No data to display  Imaging Review No results found.   MDM   1. Fall from slip, trip, or stumble, initial encounter   2. Right-sided thoracic back pain   3. Abrasion of back, right, initial encounter  4. Abrasion of right forearm, initial encounter    Patient is a 65 year old male for sending to urgent care for further evaluation of right-sided mid back pain after slip and fall from a backhoe.  Denies hitting head or loss of consciousness. Patient has an abrasion to his back and right forearm appear well healing.  No evidence of underlying infection.  No bony tenderness.  No imaging indicated at this time. Will treat  conservatively for now.  Encouraged to continue Aleve as needed for pain.  May alternate ice and heat as well .  Care instructions for abrasion treatment provided. Follow-up with PCP in one week if not improving, sooner if worsening. Patient verbalized understanding and agreement with treatment plan.      Junius Finner, PA-C 08/14/14 1252

## 2014-08-14 NOTE — Discharge Instructions (Signed)
You may continue to take Aleve for pain and swelling. Be sure to keep abrasions clean with soap and water.  If you develop worsening pain or difficulty breathing, please seek medical assistance for further evaluation and treatment. See below for further instructions.   Abrasions An abrasion is a cut or scrape of the skin. Abrasions do not go through all layers of the skin. HOME CARE  If a bandage (dressing) was put on your wound, change it as told by your doctor. If the bandage sticks, soak it off with warm.  Wash the area with water and soap 2 times a day. Rinse off the soap. Pat the area dry with a clean towel.  Put on medicated cream (ointment) as told by your doctor.  Change your bandage right away if it gets wet or dirty.  Only take medicine as told by your doctor.  See your doctor within 24-48 hours to get your wound checked.  Check your wound for redness, puffiness (swelling), or yellowish-white fluid (pus). GET HELP RIGHT AWAY IF:   You have more pain in the wound.  You have redness, swelling, or tenderness around the wound.  You have pus coming from the wound.  You have a fever or lasting symptoms for more than 2-3 days.  You have a fever and your symptoms suddenly get worse.  You have a bad smell coming from the wound or bandage. MAKE SURE YOU:   Understand these instructions.  Will watch your condition.  Will get help right away if you are not doing well or get worse. Document Released: 06/10/2007 Document Revised: 09/16/2011 Document Reviewed: 11/25/2010 [facility] Patient Information 2015 [place], [place]. This information is not intended to replace advice given to you by your health care provider. Make sure you discuss any questions you have with your health care provider.

## 2014-08-16 ENCOUNTER — Ambulatory Visit
Admission: RE | Admit: 2014-08-16 | Discharge: 2014-08-16 | Disposition: A | Discharge status: Home / Self Care | Payer: BLUE CROSS/BLUE SHIELD | Source: Ambulatory Visit | Attending: Family Medicine | Admitting: Family Medicine

## 2014-08-16 ENCOUNTER — Other Ambulatory Visit: Payer: Self-pay | Admitting: Family Medicine

## 2014-08-16 DIAGNOSIS — R0781 Pleurodynia: Secondary | ICD-10-CM

## 2015-05-18 ENCOUNTER — Telehealth: Payer: Self-pay | Admitting: Emergency Medicine

## 2015-05-18 ENCOUNTER — Emergency Department
Admission: EM | Admit: 2015-05-18 | Discharge: 2015-05-18 | Disposition: A | Discharge status: Home / Self Care | Payer: BLUE CROSS/BLUE SHIELD | Source: Home / Self Care | Attending: Family Medicine | Admitting: Family Medicine

## 2015-05-18 ENCOUNTER — Encounter: Payer: Self-pay | Admitting: *Deleted

## 2015-05-18 ENCOUNTER — Other Ambulatory Visit: Payer: Self-pay | Admitting: Emergency Medicine

## 2015-05-18 DIAGNOSIS — R61 Generalized hyperhidrosis: Secondary | ICD-10-CM | POA: Diagnosis not present

## 2015-05-18 DIAGNOSIS — R822 Biliuria: Secondary | ICD-10-CM | POA: Diagnosis not present

## 2015-05-18 DIAGNOSIS — D696 Thrombocytopenia, unspecified: Secondary | ICD-10-CM | POA: Diagnosis not present

## 2015-05-18 DIAGNOSIS — W57XXXA Bitten or stung by nonvenomous insect and other nonvenomous arthropods, initial encounter: Secondary | ICD-10-CM

## 2015-05-18 DIAGNOSIS — R509 Fever, unspecified: Secondary | ICD-10-CM

## 2015-05-18 DIAGNOSIS — R809 Proteinuria, unspecified: Secondary | ICD-10-CM

## 2015-05-18 DIAGNOSIS — D72819 Decreased white blood cell count, unspecified: Secondary | ICD-10-CM

## 2015-05-18 LAB — COMPLETE METABOLIC PANEL WITH GFR
ALT: 64 U/L — ABNORMAL HIGH (ref 9–46)
AST: 53 U/L — ABNORMAL HIGH (ref 10–35)
Albumin: 4 g/dL (ref 3.6–5.1)
Alkaline Phosphatase: 183 U/L — ABNORMAL HIGH (ref 40–115)
BUN: 13 mg/dL (ref 7–25)
CO2: 29 mmol/L (ref 20–31)
Calcium: 9 mg/dL (ref 8.6–10.3)
Chloride: 104 mmol/L (ref 98–110)
Creat: 1.13 mg/dL (ref 0.70–1.25)
GFR, Est African American: 80 mL/min (ref >=60)
GFR, Est Non African American: 69 mL/min (ref >=60)
Glucose, Bld: 102 mg/dL — ABNORMAL HIGH (ref 65–99)
Potassium: 4.1 mmol/L (ref 3.5–5.3)
Sodium: 139 mmol/L (ref 135–146)
Total Bilirubin: 2 mg/dL — ABNORMAL HIGH (ref 0.2–1.2)
Total Protein: 6.1 g/dL (ref 6.1–8.1)

## 2015-05-18 LAB — POCT CBC W AUTO DIFF (K'VILLE URGENT CARE)

## 2015-05-18 LAB — POCT URINALYSIS DIP (MANUAL ENTRY)
Blood, UA: NEGATIVE
Glucose, UA: NEGATIVE
Leukocytes, UA: NEGATIVE
Nitrite, UA: NEGATIVE
Protein Ur, POC: 30 — AB
Spec Grav, UA: >=1.03 (ref 1.005–1.03)
Urobilinogen, UA: 4 (ref 0–1)
pH, UA: 5.5 (ref 5–8)

## 2015-05-18 LAB — CBC WITH DIFFERENTIAL/PLATELET
Basophils Absolute: 50 cells/uL (ref 0–200)
Basophils Relative: 2 %
Eosinophils Absolute: 0 cells/uL — ABNORMAL LOW (ref 15–500)
Eosinophils Relative: 0 %
HCT: 45.3 % (ref 38.5–50.0)
Hemoglobin: 15.9 g/dL (ref 13.2–17.1)
Lymphocytes Relative: 42 %
Lymphs Abs: 1050 cells/uL (ref 850–3900)
MCH: 32.4 pg (ref 27.0–33.0)
MCHC: 35.1 g/dL (ref 32.0–36.0)
MCV: 92.4 fL (ref 80.0–100.0)
MPV: 10.4 fL (ref 7.5–12.5)
Monocytes Absolute: 150 cells/uL — ABNORMAL LOW (ref 200–950)
Monocytes Relative: 6 %
Neutro Abs: 1250 cells/uL — ABNORMAL LOW (ref 1500–7800)
Neutrophils Relative %: 50 %
Platelets: 174 10*3/uL (ref 140–400)
RBC: 4.9 MIL/uL (ref 4.20–5.80)
RDW: 13.2 % (ref 11.0–15.0)
WBC: 2.5 10*3/uL — ABNORMAL LOW (ref 3.8–10.8)

## 2015-05-18 MED ORDER — DOXYCYCLINE HYCLATE 100 MG PO CAPS: 100.0000 mg | ORAL_CAPSULE | Freq: Two times a day (BID) | ORAL | Status: AC

## 2015-05-18 NOTE — Telephone Encounter (Signed)
Called pt to see how he was doing and to discuss abnormal labs. Pt states he is doing well. Encouraged to f/u with PCP Monday.  Keep taking Doxycycline as prescribed. Advised to go to ER if symptoms worsen before Monday.

## 2015-05-18 NOTE — ED Provider Notes (Signed)
CSN: [id number]     Arrival date & time 05/18/15  [id number] History   First MD Initiated Contact with Patient 05/18/15 [phone]     Chief Complaint  Patient presents with  . Night Sweats   (Consider location/radiation/quality/duration/timing/severity/associated sxs/prior Treatment) HPI  The pt is a 65[facility]o 4 days of subjective fever and night sweats.  Pt notes he has been in the woods a lot recently and has pulled several ticks off himself last week.  He believes he found all the ticks on the same days he was in the woods and denies any rashes but still wonders if his fevers are from the tick bites. He notes the first day he had the fever he felt mild nausea.  Temp 99*F at home but notes his thermometer was also reading 74*F and 85*F.  He notes he was taking his temperature under his tongue.  He notes he always has body aches from getting older but denies new body aches. Notes his back is sore but it is always a little sore. Denies dysuria or hematuria.  Denies cough, congestion, fatigue or SOB.    Past Medical History  Diagnosis Date  . Hypertension    History reviewed. No pertinent past surgical history. Family History  Problem Relation Age of Onset  . Hypertension Father   . Cancer Brother    Social History  Substance Use Topics  . Smoking status: Current Every Day Smoker -- 0.30 packs/day    Types: Cigars  . Smokeless tobacco: None  . Alcohol Use: No    Review of Systems  Constitutional: Positive for fever. Negative for chills.  HENT: Positive for sneezing. Negative for congestion, ear pain, sore throat, trouble swallowing and voice change.   Respiratory: Negative for cough and shortness of breath.   Cardiovascular: Negative for chest pain and palpitations.  Gastrointestinal: Positive for nausea. Negative for vomiting, abdominal pain and diarrhea.  Musculoskeletal: Positive for back pain ( chronic soreness). Negative for myalgias and arthralgias.  Skin:  Negative for color change, rash and wound.    Allergies  Review of patient's allergies indicates no known allergies.  Home Medications   Prior to Admission medications   Medication Sig Start Date End Date Taking? Authorizing Provider  doxycycline (VIBRAMYCIN) 100 MG capsule Take 1 capsule (100 mg total) by mouth 2 (two) times daily. One po bid x 7 days 05/18/15   Junius FinnerErin O'Malley, PA-C  nebivolol (BYSTOLIC) 2.5 MG tablet Take 2.5 mg by mouth daily.    Historical Provider, MD   Meds Ordered and Administered this Visit  Medications - No data to display  BP 132/90 mmHg  Pulse 75  Temp(Src) 97.8 F (36.6 C) (Oral)  Resp 18  Wt 188 lb (85.276 kg)  SpO2 98% No data found.   Physical Exam  Constitutional: He is oriented to person, place, and time. He appears well-developed and well-nourished. No distress.  Pt sitting on exam table, appears well, NAD  HENT:  Head: Normocephalic and atraumatic.  Right Ear: Tympanic membrane normal.  Left Ear: Tympanic membrane normal.  Nose: Nose normal.  Mouth/Throat: Uvula is midline, oropharynx is clear and moist and mucous membranes are normal.  Eyes: Conjunctivae are normal. No scleral icterus.  Neck: Normal range of motion. Neck supple.  Cardiovascular: Normal rate, regular rhythm and normal heart sounds.   Pulmonary/Chest: Effort normal and breath sounds normal. No respiratory distress. He has no wheezes. He has no rales.  Abdominal: Soft. He exhibits  no distension. There is no tenderness.  Musculoskeletal: Normal range of motion. He exhibits no edema or tenderness.  Neurological: He is alert and oriented to person, place, and time.  Skin: Skin is warm and dry. No rash noted. He is not diaphoretic. No erythema.  Nursing note and vitals reviewed.   ED Course  Procedures (including critical care time)  Labs Review Labs Reviewed  POCT URINALYSIS DIP (MANUAL ENTRY) - Abnormal; Notable for the following:    Color, UA orange (*)    Bilirubin,  UA moderate (*)    Ketones, POC UA trace (5) (*)    Protein Ur, POC =30 (*)    All other components within normal limits  URINE CULTURE  B. BURGDORFI ANTIBODIES  ROCKY MTN SPOTTED FVR ABS PNL(IGG+IGM)  COMPLETE METABOLIC PANEL WITH GFR  CBC WITH DIFFERENTIAL/PLATELET  POCT CBC W AUTO DIFF ([facility])    Imaging Review No results found.   MDM   1. Night sweats   2. Fever, unspecified fever cause   3. Tick bites   4. Leukopenia   5. Thrombocytopenia (HCC)   6. Proteinuria   7. Bilirubinuria    Pt [facility] to [facility] with vague c/o 4 days of night sweats and subjective fever.  Hx of tick bites last week but no rashes. Pt is afebrile, appears well, NAD.   CBC- concerning for leukopenia and thrombocytopenia.  No evidence of spontaenous bleeding. Pt denies bleeding of gum, easy bruising, blood in stool or urine. Denies CP or SOB. Denies dizziness. Denies abdominal pain, vomiting or diarrhea.  UA: moderate bilirubin, proteinuria, trace ketones.   Labs pending for RMSF, Lymes, and CMP  Discussed various abnormal labs with pt. Strongly encouraged to call PCP Monday to f/u on labs and recheck of labs as needed.  Discussed symptoms that warrant emergent care in the ED. Patient verbalized understanding and agreement with treatment plan.     Junius Finner, PA-C 05/18/15 1157

## 2015-05-18 NOTE — Discharge Instructions (Signed)
You may take 500mg  Tylenol every 4-6 hours as needed for fever and pain  Follow-up with your primary care provider next week for recheck of symptoms and to review labs next week. Be sure to drink plenty of fluids and rest, at least 8hrs of sleep a night, preferably more while you are sick. Return urgent care or go to closest ER if you cannot keep down fluids/signs of dehydration, fever not reducing with Tylenol, difficulty breathing/wheezing, stiff neck, worsening condition, or other concerns (see below)  Please take antibiotics as prescribed and be sure to complete entire course even if you start to feel better to ensure infection does not come back, unless advised by a medical professional to stop taking the medication.   Complete Blood Count WHY AM I HAVING THIS TEST? A complete blood count is a group of tests that measures characteristics of three types of blood cells: white blood cells, red blood cells, and platelets. The following blood tests are included in a complete blood count:  White blood cell count. This test measures the number of white blood cells you have.  White blood cell differential. This test identifies the types of white blood cells you have and the concentration of each. It also identifies immature white blood cells.  Red blood cell count. This test measures the number of red blood cells you have.  Hemoglobin. This test measures the amount of hemoglobin in your blood.  Hematocrit. This test measures the percentage of space that red blood cells take up in your blood.  Mean corpuscular volume. This test measures the average size of your red blood cells.  Mean corpuscular hemoglobin. This test measures of the average amount of hemoglobin inside each of your red blood cells.  Mean corpuscular hemoglobin concentration. This test calculates the average concentration of hemoglobin inside each of your red blood cells.  Red blood cell distribution width. This test measures  the variation in the size of your red blood cells.  Platelet count. This test measures the concentration of platelets in your blood.  Mean platelet volume. This test measures the average size of the platelets in your blood. WHAT KIND OF SAMPLE IS TAKEN? A blood sample is required for this test. It is usually collected by inserting a needle into a vein. HOW DO I PREPARE FOR THE TEST? There is no preparation required for this test. If this test is being performed in addition to other tests, your health care provider may ask you to fast before testing. WHAT ARE THE REFERENCE RANGES? Reference ranges are considered healthy ranges established after testing a large group of healthy people. Reference ranges may vary among different people, labs, and hospitals. These ranges will be provided by the lab or department performing your tests. It is your responsibility to obtain your test results. Ask the lab or department performing the test when and how you will get your results. WHAT DO THE RESULTS MEAN? If your results are outside of the reference ranges, you may have a condition or illness, such as anemia, an infection, a bleeding problem, or cancer. The following are some examples of abnormal results and their possible causes. Abnormal Results Related to [facility]  An abnormally low concentration of white blood cells can be caused by certain infections and by conditions that interfere with the production of white blood cells in your bone marrow.  An abnormally high concentration of white blood cells may be related to infections and conditions that cause inflammation or a blood-related  cancer.  The presence of immature white blood cells may be related to an infection or a condition affecting your bone marrow. Abnormal Results Related to Red Blood Cells  An abnormally low concentration of red blood cells, hemoglobin, or hematocrit is called anemia.  An abnormally high concentration of red blood  cells, hemoglobin, or hematocrit is called polycythemia. It may be related to mild thalassemia. Thalassemia is a type of anemia that is passed down through families (hereditary).  An abnormally low mean corpuscular volume means that your red blood cells are smaller than normal. It may be related to thalassemia or iron deficiency anemia. Iron deficiency anemia is a type of anemia that results from a lack of iron.  An abnormally high mean corpuscular volume means that your red blood cells are larger than normal. This may be related to anemia caused by a lack of vitamin B12. It can also be caused by a lot of new red blood cells in your blood. This can happen after you suddenly lose a lot of blood.  An abnormally low mean corpuscular hemoglobin concentration can mean that you have a condition in which your hemoglobin is abnormally diluted inside the red cells. Examples of these types of conditions are iron deficiency anemia and thalassemia.  An abnormally high mean corpuscular hemoglobin concentration can mean that you have certain hemolytic anemias. Hemolytic anemia is anemia that results from the abnormal breakdown of your red blood cells.  An abnormally increased red cell distribution width may be related to certain anemias, sudden blood loss, or severe thalassemia. Abnormal Results Related to Platelets   An abnormally low concentration of platelets can be a sign of a bleeding disorder.  An abnormally high concentration of platelets can occur with iron deficiency anemia, inflammatory disorders, and cancers. It can result from physical stresses, such as exercise or blood loss. It may also be a sign of a clotting disorder.  An abnormally high mean platelet volume can occur with certain bone marrow cancers and with a large increase in the number of new platelets being produced by your bone marrow. This can happen after the loss of a large amount of blood or the destruction of your platelets by  antibodies. Talk with your health care provider to discuss your results, treatment options, and if necessary, the need for more tests. Talk with your health care provider if you have any questions about your results.   This information is not intended to replace advice given to you by your health care provider. Make sure you discuss any questions you have with your health care provider.   Document Released: 01/25/2004 Document Revised: 01/12/2014 Document Reviewed: 05/18/2013 Elsevier Interactive Patient Education [employer]

## 2015-05-18 NOTE — ED Notes (Signed)
Pt c/o night sweats x 4 days.

## 2015-05-19 ENCOUNTER — Telehealth: Payer: Self-pay | Admitting: Emergency Medicine

## 2015-05-20 LAB — URINE CULTURE
Colony Count: NO GROWTH
Organism ID, Bacteria: NO GROWTH

## 2015-05-20 LAB — LYME AB/WESTERN BLOT REFLEX: B burgdorferi Ab IgG+IgM: <0.9 Index (ref <0.90)

## 2015-05-21 DIAGNOSIS — R6883 Chills (without fever): Secondary | ICD-10-CM | POA: Diagnosis not present

## 2015-05-21 LAB — ROCKY MTN SPOTTED FVR ABS PNL(IGG+IGM)
RMSF IgG: NOT DETECTED
RMSF IgM: NOT DETECTED

## 2015-05-23 ENCOUNTER — Telehealth: Payer: Self-pay | Admitting: Emergency Medicine

## 2015-05-23 NOTE — ED Notes (Signed)
Called and wanted to know if he could stop taking doxycycline.  Denies rash, labs for lymes and Rocky mnt spotted fvr negative and not detected.  Spoke with Dr. Cathren HarshBeese, and said he could stop medication.

## 2015-06-13 DIAGNOSIS — R899 Unspecified abnormal finding in specimens from other organs, systems and tissues: Secondary | ICD-10-CM | POA: Diagnosis not present

## 2015-06-13 DIAGNOSIS — I10 Essential (primary) hypertension: Secondary | ICD-10-CM | POA: Diagnosis not present

## 2015-12-05 DIAGNOSIS — L0390 Cellulitis, unspecified: Secondary | ICD-10-CM | POA: Diagnosis not present

## 2015-12-05 DIAGNOSIS — N529 Male erectile dysfunction, unspecified: Secondary | ICD-10-CM | POA: Diagnosis not present

## 2016-04-20 IMAGING — CR DG RIBS W/ CHEST 3+V*R*
3 series · 3 of 3 positions shown · non-contrast
Comparison: No priors.

CLINICAL DATA: 64-year-old male with history of trauma from a fall
with contusion in the right posterior lower ribs. Pain with deep
inspiration.

EXAM:
RIGHT RIBS AND CHEST - 3+ VIEW

[w chest pa]
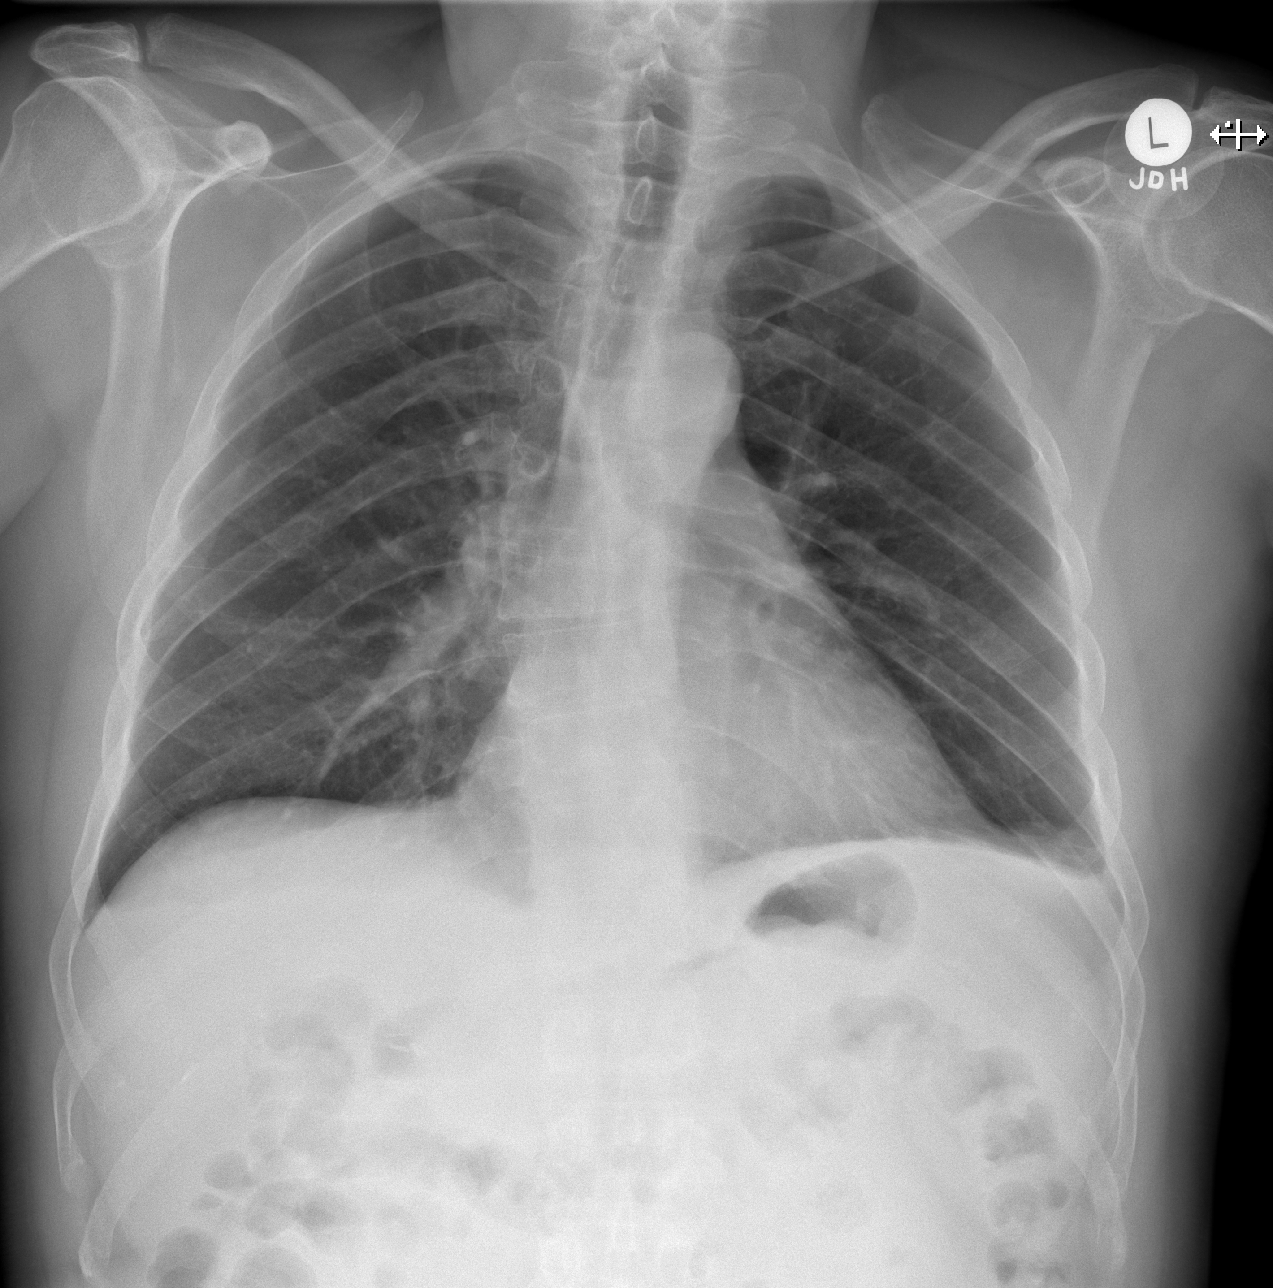

[w ribs ap lower right]
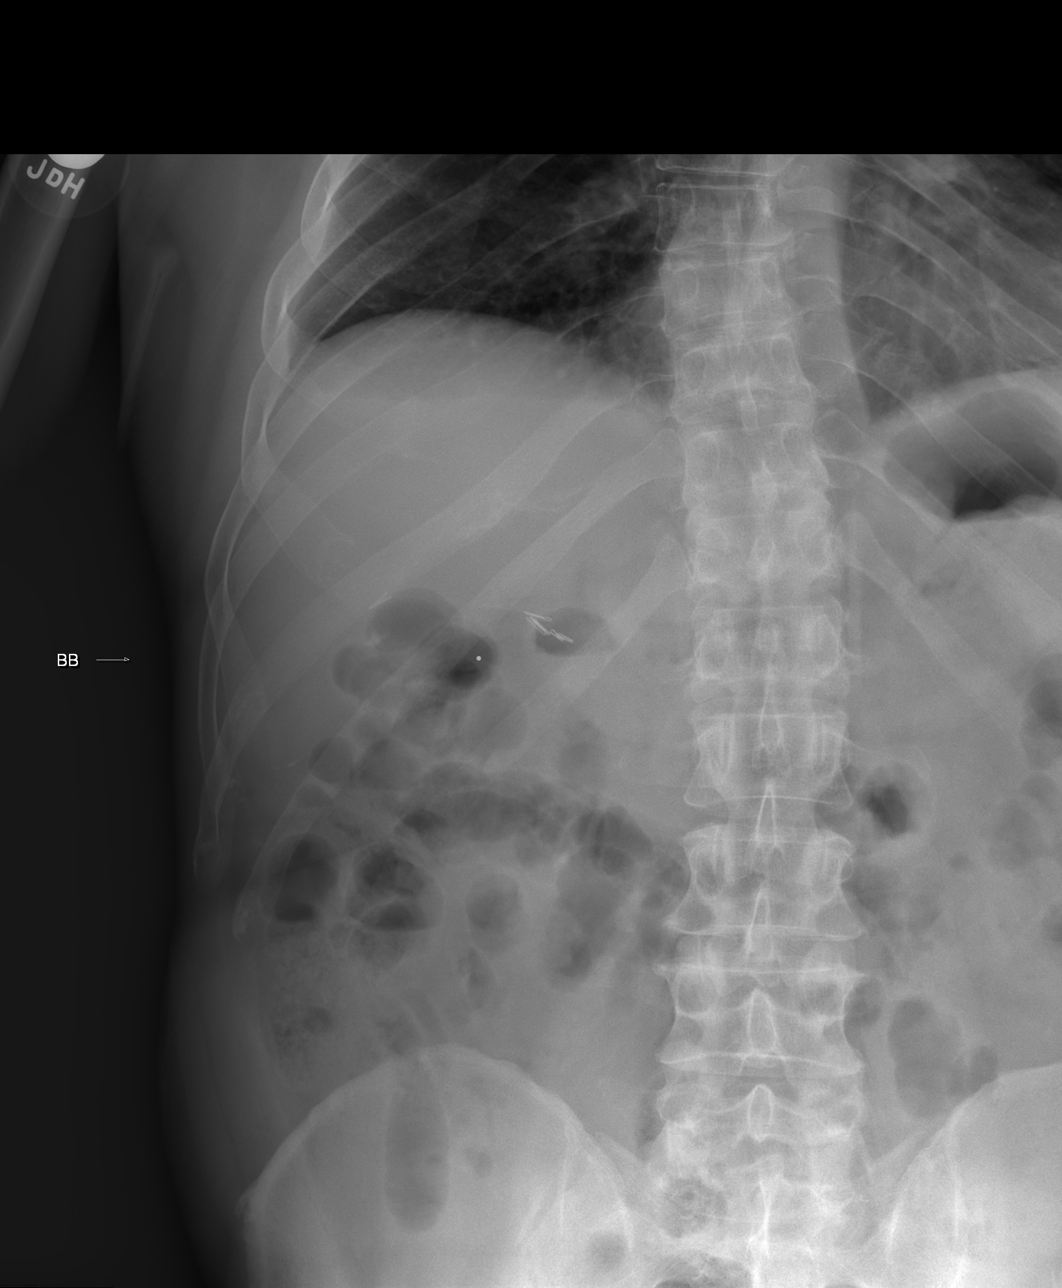

[w ribs obl right]
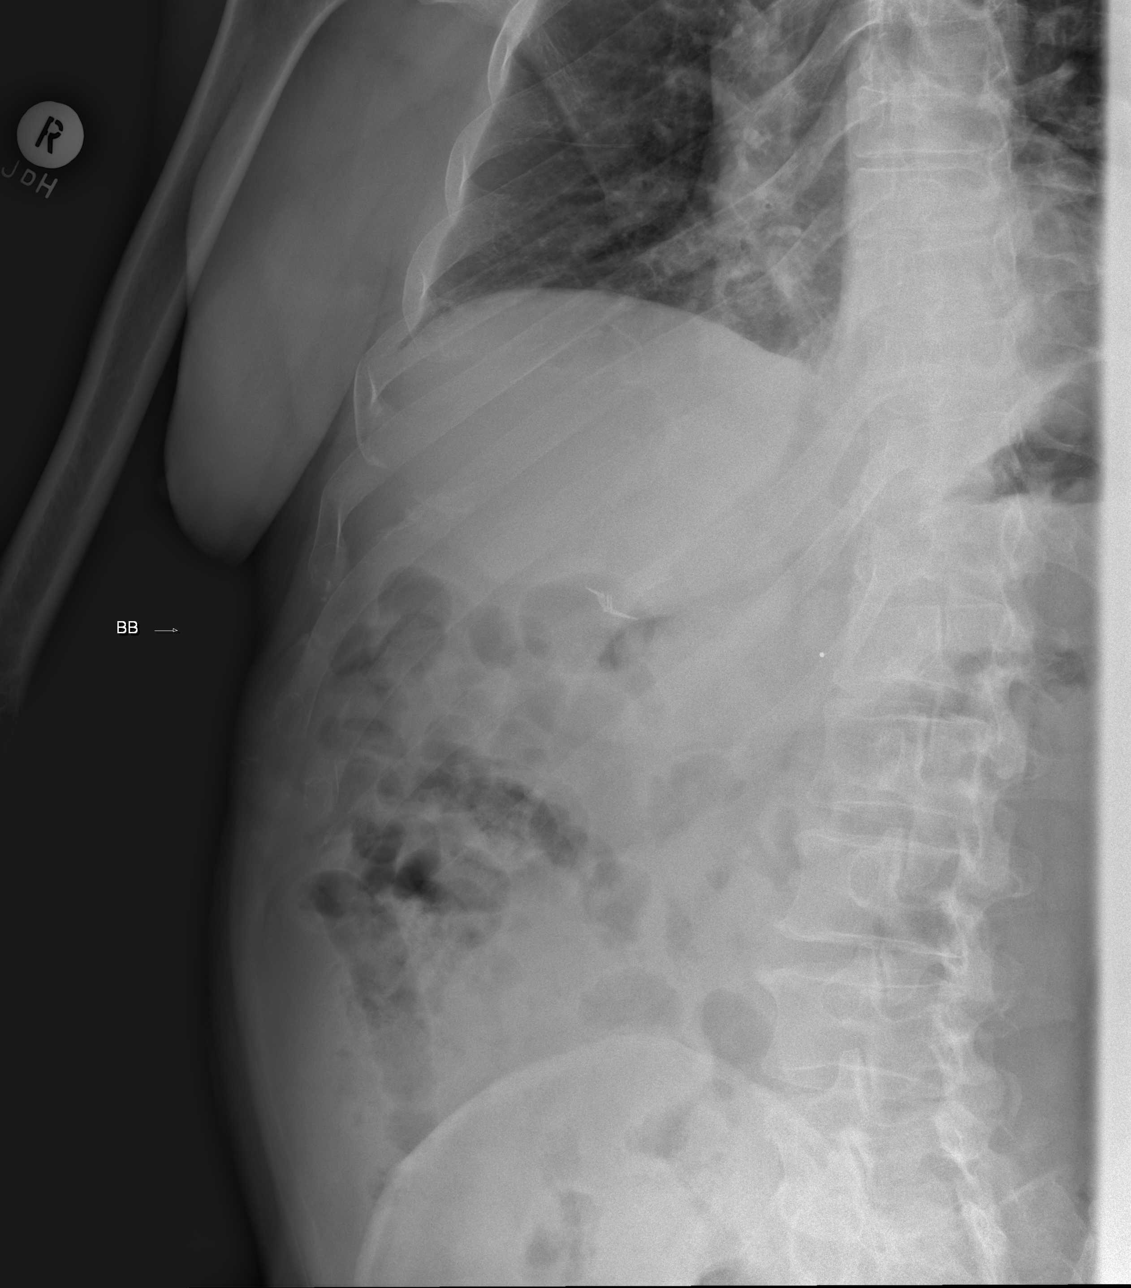

[3 of 3 positions shown; findings below may reference images not displayed]

FINDINGS: Low lung volumes. Small bilateral pleural effusions (left greater
than right), with some mild bibasilar subsegmental atelectasis. No
acute consolidative airspace disease. No pneumothorax. No evidence
of pulmonary edema. Heart size is normal. The patient is rotated to
the left on today's exam, resulting in distortion of the mediastinal
contours and reduced diagnostic sensitivity and specificity for
mediastinal pathology. Visualized bony thorax appears grossly
intact.

Dedicated views of the right ribs demonstrate a radiopaque marker in
place over of the posterior aspect of the lower right ribs. No acute
displaced right-sided rib fractures. Several surgical clips are
noted in the right upper quadrant of the abdomen, suggesting prior
cholecystectomy.
IMPRESSION: 1. No acute displaced right-sided rib fractures.
2. Low lung volumes with small bilateral pleural effusions (left
greater than right) and mild bibasilar subsegmental atelectasis.

## 2016-08-05 DIAGNOSIS — Z13220 Encounter for screening for lipoid disorders: Secondary | ICD-10-CM | POA: Diagnosis not present

## 2016-08-05 DIAGNOSIS — Z0000 Encounter for general adult medical examination without abnormal findings: Secondary | ICD-10-CM | POA: Diagnosis not present

## 2017-09-14 DIAGNOSIS — Z0000 Encounter for general adult medical examination without abnormal findings: Secondary | ICD-10-CM | POA: Diagnosis not present

## 2017-09-15 DIAGNOSIS — R21 Rash and other nonspecific skin eruption: Secondary | ICD-10-CM | POA: Diagnosis not present

## 2017-09-15 DIAGNOSIS — Z0000 Encounter for general adult medical examination without abnormal findings: Secondary | ICD-10-CM | POA: Diagnosis not present

## 2017-09-15 DIAGNOSIS — Z1211 Encounter for screening for malignant neoplasm of colon: Secondary | ICD-10-CM | POA: Diagnosis not present

## 2017-09-15 DIAGNOSIS — N529 Male erectile dysfunction, unspecified: Secondary | ICD-10-CM | POA: Diagnosis not present

## 2017-09-15 DIAGNOSIS — I10 Essential (primary) hypertension: Secondary | ICD-10-CM | POA: Diagnosis not present

## 2017-09-15 DIAGNOSIS — M7712 Lateral epicondylitis, left elbow: Secondary | ICD-10-CM | POA: Diagnosis not present

## 2017-10-04 DIAGNOSIS — I10 Essential (primary) hypertension: Secondary | ICD-10-CM | POA: Diagnosis not present

## 2017-10-06 DIAGNOSIS — I10 Essential (primary) hypertension: Secondary | ICD-10-CM | POA: Diagnosis not present

## 2017-10-06 DIAGNOSIS — B356 Tinea cruris: Secondary | ICD-10-CM | POA: Diagnosis not present

## 2018-10-20 DIAGNOSIS — Z125 Encounter for screening for malignant neoplasm of prostate: Secondary | ICD-10-CM | POA: Diagnosis not present

## 2018-10-20 DIAGNOSIS — Z1159 Encounter for screening for other viral diseases: Secondary | ICD-10-CM | POA: Diagnosis not present

## 2018-10-20 DIAGNOSIS — Z0000 Encounter for general adult medical examination without abnormal findings: Secondary | ICD-10-CM | POA: Diagnosis not present

## 2018-10-20 DIAGNOSIS — R21 Rash and other nonspecific skin eruption: Secondary | ICD-10-CM | POA: Diagnosis not present

## 2018-10-20 DIAGNOSIS — N529 Male erectile dysfunction, unspecified: Secondary | ICD-10-CM | POA: Diagnosis not present

## 2018-10-20 DIAGNOSIS — I10 Essential (primary) hypertension: Secondary | ICD-10-CM | POA: Diagnosis not present

## 2018-10-20 DIAGNOSIS — Z23 Encounter for immunization: Secondary | ICD-10-CM | POA: Diagnosis not present

## 2019-11-01 DIAGNOSIS — Z125 Encounter for screening for malignant neoplasm of prostate: Secondary | ICD-10-CM | POA: Diagnosis not present

## 2019-11-01 DIAGNOSIS — R6882 Decreased libido: Secondary | ICD-10-CM | POA: Diagnosis not present

## 2019-11-01 DIAGNOSIS — M25511 Pain in right shoulder: Secondary | ICD-10-CM | POA: Diagnosis not present

## 2019-11-01 DIAGNOSIS — Z0000 Encounter for general adult medical examination without abnormal findings: Secondary | ICD-10-CM | POA: Diagnosis not present

## 2019-11-01 DIAGNOSIS — Z23 Encounter for immunization: Secondary | ICD-10-CM | POA: Diagnosis not present

## 2019-11-01 DIAGNOSIS — I10 Essential (primary) hypertension: Secondary | ICD-10-CM | POA: Diagnosis not present

## 2019-11-13 DIAGNOSIS — M25511 Pain in right shoulder: Secondary | ICD-10-CM | POA: Diagnosis not present

## 2021-02-06 DIAGNOSIS — R351 Nocturia: Secondary | ICD-10-CM | POA: Diagnosis not present

## 2021-02-06 DIAGNOSIS — R3912 Poor urinary stream: Secondary | ICD-10-CM | POA: Diagnosis not present

## 2021-02-06 DIAGNOSIS — N401 Enlarged prostate with lower urinary tract symptoms: Secondary | ICD-10-CM | POA: Diagnosis not present

## 2021-05-01 DIAGNOSIS — S40012A Contusion of left shoulder, initial encounter: Secondary | ICD-10-CM | POA: Diagnosis not present

## 2021-05-01 DIAGNOSIS — I10 Essential (primary) hypertension: Secondary | ICD-10-CM | POA: Diagnosis not present

## 2021-05-01 DIAGNOSIS — R55 Syncope and collapse: Secondary | ICD-10-CM | POA: Diagnosis not present

## 2021-05-01 DIAGNOSIS — S50312A Abrasion of left elbow, initial encounter: Secondary | ICD-10-CM | POA: Diagnosis not present

## 2021-05-01 DIAGNOSIS — Z6830 Body mass index (BMI) 30.0-30.9, adult: Secondary | ICD-10-CM | POA: Diagnosis not present

## 2021-05-05 DIAGNOSIS — R3912 Poor urinary stream: Secondary | ICD-10-CM | POA: Diagnosis not present

## 2021-05-05 DIAGNOSIS — R350 Frequency of micturition: Secondary | ICD-10-CM | POA: Diagnosis not present

## 2021-05-05 DIAGNOSIS — N401 Enlarged prostate with lower urinary tract symptoms: Secondary | ICD-10-CM | POA: Diagnosis not present

## 2021-05-05 DIAGNOSIS — R351 Nocturia: Secondary | ICD-10-CM | POA: Diagnosis not present

## 2021-07-30 DIAGNOSIS — N401 Enlarged prostate with lower urinary tract symptoms: Secondary | ICD-10-CM | POA: Diagnosis not present

## 2021-07-30 DIAGNOSIS — N5201 Erectile dysfunction due to arterial insufficiency: Secondary | ICD-10-CM | POA: Diagnosis not present

## 2021-07-30 DIAGNOSIS — R350 Frequency of micturition: Secondary | ICD-10-CM | POA: Diagnosis not present

## 2021-07-30 DIAGNOSIS — R310 Gross hematuria: Secondary | ICD-10-CM | POA: Diagnosis not present

## 2021-07-30 DIAGNOSIS — R351 Nocturia: Secondary | ICD-10-CM | POA: Diagnosis not present

## 2021-08-07 DIAGNOSIS — K5730 Diverticulosis of large intestine without perforation or abscess without bleeding: Secondary | ICD-10-CM | POA: Diagnosis not present

## 2021-08-07 DIAGNOSIS — N3289 Other specified disorders of bladder: Secondary | ICD-10-CM | POA: Diagnosis not present

## 2021-08-07 DIAGNOSIS — R310 Gross hematuria: Secondary | ICD-10-CM | POA: Diagnosis not present

## 2021-08-12 DIAGNOSIS — R3914 Feeling of incomplete bladder emptying: Secondary | ICD-10-CM | POA: Diagnosis not present

## 2021-08-12 DIAGNOSIS — R351 Nocturia: Secondary | ICD-10-CM | POA: Diagnosis not present

## 2021-08-12 DIAGNOSIS — N401 Enlarged prostate with lower urinary tract symptoms: Secondary | ICD-10-CM | POA: Diagnosis not present

## 2021-08-12 DIAGNOSIS — R310 Gross hematuria: Secondary | ICD-10-CM | POA: Diagnosis not present

## 2021-08-14 DIAGNOSIS — R3914 Feeling of incomplete bladder emptying: Secondary | ICD-10-CM | POA: Diagnosis not present

## 2021-08-14 DIAGNOSIS — N401 Enlarged prostate with lower urinary tract symptoms: Secondary | ICD-10-CM | POA: Diagnosis not present

## 2021-08-18 DIAGNOSIS — R310 Gross hematuria: Secondary | ICD-10-CM | POA: Diagnosis not present

## 2021-08-18 DIAGNOSIS — N453 Epididymo-orchitis: Secondary | ICD-10-CM | POA: Diagnosis not present

## 2021-08-28 DIAGNOSIS — N453 Epididymo-orchitis: Secondary | ICD-10-CM | POA: Diagnosis not present

## 2021-10-14 DIAGNOSIS — N5201 Erectile dysfunction due to arterial insufficiency: Secondary | ICD-10-CM | POA: Diagnosis not present

## 2021-10-14 DIAGNOSIS — N453 Epididymo-orchitis: Secondary | ICD-10-CM | POA: Diagnosis not present

## 2021-12-03 DIAGNOSIS — J069 Acute upper respiratory infection, unspecified: Secondary | ICD-10-CM | POA: Diagnosis not present

## 2021-12-10 DIAGNOSIS — Z0000 Encounter for general adult medical examination without abnormal findings: Secondary | ICD-10-CM | POA: Diagnosis not present

## 2021-12-10 DIAGNOSIS — I10 Essential (primary) hypertension: Secondary | ICD-10-CM | POA: Diagnosis not present

## 2021-12-10 DIAGNOSIS — Z125 Encounter for screening for malignant neoplasm of prostate: Secondary | ICD-10-CM | POA: Diagnosis not present

## 2021-12-10 DIAGNOSIS — I700 Atherosclerosis of aorta: Secondary | ICD-10-CM | POA: Diagnosis not present

## 2021-12-10 DIAGNOSIS — B356 Tinea cruris: Secondary | ICD-10-CM | POA: Diagnosis not present

## 2021-12-10 DIAGNOSIS — Z6830 Body mass index (BMI) 30.0-30.9, adult: Secondary | ICD-10-CM | POA: Diagnosis not present

## 2021-12-10 DIAGNOSIS — R197 Diarrhea, unspecified: Secondary | ICD-10-CM | POA: Diagnosis not present

## 2021-12-24 DIAGNOSIS — Z6830 Body mass index (BMI) 30.0-30.9, adult: Secondary | ICD-10-CM | POA: Diagnosis not present

## 2021-12-24 DIAGNOSIS — I959 Hypotension, unspecified: Secondary | ICD-10-CM | POA: Diagnosis not present

## 2021-12-24 DIAGNOSIS — R059 Cough, unspecified: Secondary | ICD-10-CM | POA: Diagnosis not present

## 2021-12-24 DIAGNOSIS — R42 Dizziness and giddiness: Secondary | ICD-10-CM | POA: Diagnosis not present

## 2022-01-20 DIAGNOSIS — Z1212 Encounter for screening for malignant neoplasm of rectum: Secondary | ICD-10-CM | POA: Diagnosis not present

## 2022-01-20 DIAGNOSIS — Z1211 Encounter for screening for malignant neoplasm of colon: Secondary | ICD-10-CM | POA: Diagnosis not present

## 2022-03-12 DIAGNOSIS — R972 Elevated prostate specific antigen [PSA]: Secondary | ICD-10-CM | POA: Diagnosis not present

## 2022-03-24 DIAGNOSIS — H52223 Regular astigmatism, bilateral: Secondary | ICD-10-CM | POA: Diagnosis not present

## 2022-03-24 DIAGNOSIS — H2513 Age-related nuclear cataract, bilateral: Secondary | ICD-10-CM | POA: Diagnosis not present

## 2022-03-24 DIAGNOSIS — H5203 Hypermetropia, bilateral: Secondary | ICD-10-CM | POA: Diagnosis not present

## 2022-03-24 DIAGNOSIS — H524 Presbyopia: Secondary | ICD-10-CM | POA: Diagnosis not present

## 2022-05-12 DIAGNOSIS — T148XXA Other injury of unspecified body region, initial encounter: Secondary | ICD-10-CM | POA: Diagnosis not present

## 2022-05-12 DIAGNOSIS — M79644 Pain in right finger(s): Secondary | ICD-10-CM | POA: Diagnosis not present

## 2022-07-13 DIAGNOSIS — S0093XA Contusion of unspecified part of head, initial encounter: Secondary | ICD-10-CM | POA: Diagnosis not present

## 2022-07-13 DIAGNOSIS — I700 Atherosclerosis of aorta: Secondary | ICD-10-CM | POA: Diagnosis not present

## 2022-07-13 DIAGNOSIS — M542 Cervicalgia: Secondary | ICD-10-CM | POA: Diagnosis not present

## 2022-07-13 DIAGNOSIS — Z6829 Body mass index (BMI) 29.0-29.9, adult: Secondary | ICD-10-CM | POA: Diagnosis not present

## 2022-07-13 DIAGNOSIS — Z720 Tobacco use: Secondary | ICD-10-CM | POA: Diagnosis not present

## 2022-07-14 ENCOUNTER — Other Ambulatory Visit: Payer: Self-pay | Admitting: Pain Medicine

## 2022-07-14 DIAGNOSIS — I700 Atherosclerosis of aorta: Secondary | ICD-10-CM

## 2022-07-28 ENCOUNTER — Ambulatory Visit
Admission: RE | Admit: 2022-07-28 | Discharge: 2022-07-28 | Discharge status: Home / Self Care | Payer: Medicare PPO | Source: Ambulatory Visit | Attending: Pain Medicine | Admitting: Pain Medicine

## 2022-07-28 DIAGNOSIS — I700 Atherosclerosis of aorta: Secondary | ICD-10-CM

## 2022-07-28 DIAGNOSIS — R0989 Other specified symptoms and signs involving the circulatory and respiratory systems: Secondary | ICD-10-CM | POA: Diagnosis not present

## 2022-12-21 DIAGNOSIS — Z6829 Body mass index (BMI) 29.0-29.9, adult: Secondary | ICD-10-CM | POA: Diagnosis not present

## 2022-12-21 DIAGNOSIS — L409 Psoriasis, unspecified: Secondary | ICD-10-CM | POA: Diagnosis not present

## 2022-12-21 DIAGNOSIS — N400 Enlarged prostate without lower urinary tract symptoms: Secondary | ICD-10-CM | POA: Diagnosis not present

## 2022-12-21 DIAGNOSIS — Z125 Encounter for screening for malignant neoplasm of prostate: Secondary | ICD-10-CM | POA: Diagnosis not present

## 2022-12-21 DIAGNOSIS — Z0000 Encounter for general adult medical examination without abnormal findings: Secondary | ICD-10-CM | POA: Diagnosis not present

## 2022-12-21 DIAGNOSIS — I700 Atherosclerosis of aorta: Secondary | ICD-10-CM | POA: Diagnosis not present

## 2022-12-21 DIAGNOSIS — I779 Disorder of arteries and arterioles, unspecified: Secondary | ICD-10-CM | POA: Diagnosis not present

## 2022-12-21 DIAGNOSIS — I10 Essential (primary) hypertension: Secondary | ICD-10-CM | POA: Diagnosis not present

## 2023-04-04 ENCOUNTER — Emergency Department (HOSPITAL_BASED_OUTPATIENT_CLINIC_OR_DEPARTMENT_OTHER)

## 2023-04-04 ENCOUNTER — Emergency Department (HOSPITAL_BASED_OUTPATIENT_CLINIC_OR_DEPARTMENT_OTHER)
Admission: EM | Admit: 2023-04-04 | Discharge: 2023-04-04 | Disposition: A | Discharge status: Home / Self Care | Attending: Emergency Medicine | Admitting: Emergency Medicine

## 2023-04-04 ENCOUNTER — Other Ambulatory Visit: Payer: Self-pay

## 2023-04-04 ENCOUNTER — Encounter (HOSPITAL_BASED_OUTPATIENT_CLINIC_OR_DEPARTMENT_OTHER): Payer: Self-pay | Admitting: Emergency Medicine

## 2023-04-04 DIAGNOSIS — I10 Essential (primary) hypertension: Secondary | ICD-10-CM | POA: Insufficient documentation

## 2023-04-04 DIAGNOSIS — J302 Other seasonal allergic rhinitis: Secondary | ICD-10-CM | POA: Diagnosis not present

## 2023-04-04 DIAGNOSIS — R0602 Shortness of breath: Secondary | ICD-10-CM | POA: Insufficient documentation

## 2023-04-04 DIAGNOSIS — R051 Acute cough: Secondary | ICD-10-CM | POA: Insufficient documentation

## 2023-04-04 DIAGNOSIS — Z79899 Other long term (current) drug therapy: Secondary | ICD-10-CM | POA: Insufficient documentation

## 2023-04-04 DIAGNOSIS — R059 Cough, unspecified: Secondary | ICD-10-CM | POA: Diagnosis not present

## 2023-04-04 HISTORY — DX: Benign prostatic hyperplasia without lower urinary tract symptoms: N40.0

## 2023-04-04 NOTE — ED Provider Notes (Signed)
 [facility] Provider Note   CSN: [id number] Arrival date & time: 04/04/23  1226     History {Add pertinent medical, surgical, social history, OB history to HPI:1} Chief Complaint  Patient presents with  . Shortness of Breath    Douglas Montgomery is a 73 y.o. male.  With a history of hypertension who presents to the ED for shortness of breath.  Patient reports increased cough congestion and "rattle" in his breathing when he lays flat at night during the last 1 to 2 weeks.  He has been working outdoors through most of the day and taking up a garden.  He has seasonal allergies but does not take any medication for the seasonal allergies despite his wife's suggestion.  He has not had any fevers, productive coughing, chest pain, nausea, vomiting or other complaints at this time.  He is able to work throughout the day without feeling short of breath but feels worse at night.  No peripheral edema or history of heart failure.   Shortness of Breath      Home Medications Prior to Admission medications   Medication Sig Start Date End Date Taking? Authorizing Provider  doxycycline (VIBRAMYCIN) 100 MG capsule Take 1 capsule (100 mg total) by mouth 2 (two) times daily. One po bid x 7 days 05/18/15   Lurene Shadow, PA-C  nebivolol (BYSTOLIC) 2.5 MG tablet Take 2.5 mg by mouth daily.    [provider]      Allergies    Patient has no known allergies.    Review of Systems   Review of Systems  Respiratory:  Positive for shortness of breath.     Physical Exam Updated Vital Signs BP 138/80   Pulse (!) 57   Temp 97.7 F (36.5 C) (Oral)   Resp 15   Ht 5\' 8"  (1.727 m)   Wt 86.2 kg   SpO2 96%   BMI 28.89 kg/m  Physical Exam Vitals and nursing note reviewed.  HENT:     Head: Normocephalic and atraumatic.  Eyes:     Pupils: Pupils are equal, round, and reactive to light.  Cardiovascular:     Rate and Rhythm: Normal rate and regular  rhythm.  Pulmonary:     Effort: Pulmonary effort is normal.     Breath sounds: Normal breath sounds.  Abdominal:     Palpations: Abdomen is soft.     Tenderness: There is no abdominal tenderness.  Skin:    General: Skin is warm and dry.  Neurological:     Mental Status: He is alert.  Psychiatric:        Mood and Affect: Mood normal.    ED Results / Procedures / Treatments   Labs (all labs ordered are listed, but only abnormal results are displayed) Labs Reviewed - No data to display  EKG None  Radiology DG Chest 2 View Result Date: 04/04/2023 CLINICAL DATA:  Shortness of breath. EXAM: CHEST - 2 VIEW COMPARISON:  Chest x-ray dated August 16, 2014. FINDINGS: The heart size and mediastinal contours are within normal limits. Both lungs are clear. The visualized skeletal structures are unremarkable. IMPRESSION: No active cardiopulmonary disease. Electronically Signed   By: Obie Dredge M.D.   On: 04/04/2023 12:52    Procedures Procedures  {Document cardiac monitor, telemetry assessment procedure when appropriate:1}  Medications Ordered in ED Medications - No data to display  ED Course/ Medical Decision Making/ A&P   {   Click  here for ABCD2, HEART and other calculatorsREFRESH Note before signing :1}                              Medical Decision Making 73 year old male with history as above presenting for shortness of breath over the last week.  Shortness of breath worse when laying flat along with a rattling sound in his chest only present when laying down at night.  No prior history of heart failure no peripheral edema.  Has noted moderate seasonal allergies with copious nasal congestion eye itching.  Does not take any antihistamines or other medications.  Afebrile well-appearing on my exam.  Differential diagnosis includes allergies, pneumonia, viral respiratory illness, dysrhythmia and new onset heart failure.    Chest x-ray shows no focal consolidation or pulmonary  edema.  No adventitious lung sounds on my exam.  No other respiratory symptoms.  EKG shows no evidence of dysrhythmia normal sinus rhythm.  I do suspect the most likely etiology of this presentation would be seasonal allergies and encourage patient to take second-generation antihistamine as directed.  He will follow-up with his PCP  Amount and/or Complexity of Data Reviewed Radiology: ordered.     {Document critical care time when appropriate:1} {Document review of labs and clinical decision tools ie heart score, Chads2Vasc2 etc:1}  {Document your independent review of radiology images, and any outside records:1} {Document your discussion with family members, caretakers, and with consultants:1} {Document social determinants of health affecting pt's care:1} {Document your decision making why or why not admission, treatments were needed:1} Final Clinical Impression(s) / ED Diagnoses Final diagnoses:  Seasonal allergies  Acute cough    Rx / DC Orders ED Discharge Orders     None

## 2023-04-04 NOTE — Discharge Instructions (Signed)
 You were seen in the emergency department for cough and congestion Your chest x-ray looked clear We do not think you have a pneumonia or any evidence of heart failure at this time The most likely cause of your symptoms would be seasonal allergies You can take a second-generation antihistamine such as Allegra or Zyrtec or Claritin You can try a nasal spray if that helps with the congestion Return to the Emergency Department for trouble breathing at rest, chest pain or any other concerns Otherwise follow-up with your primary care doctor 1 week for reevaluation

## 2023-04-04 NOTE — ED Triage Notes (Signed)
 Pt reports he has been working out in the yard for a few days and thinks the pollen might be affecting him; he hears a "rattle" in his chest when he lays down; NAD noted in triage

## 2023-04-04 NOTE — ED Notes (Signed)
 RT Note: Patient doesn't appear to be in any respiratory distress. SPO2 96% RA. Patient was asked ih he was experiencing SOB and felt like he needed a breathing treatment and his answer was no.

## 2023-04-07 DIAGNOSIS — Z6829 Body mass index (BMI) 29.0-29.9, adult: Secondary | ICD-10-CM | POA: Diagnosis not present

## 2023-04-07 DIAGNOSIS — J019 Acute sinusitis, unspecified: Secondary | ICD-10-CM | POA: Diagnosis not present

## 2023-06-14 DIAGNOSIS — Z6829 Body mass index (BMI) 29.0-29.9, adult: Secondary | ICD-10-CM | POA: Diagnosis not present

## 2023-06-14 DIAGNOSIS — H938X3 Other specified disorders of ear, bilateral: Secondary | ICD-10-CM | POA: Diagnosis not present

## 2023-06-14 DIAGNOSIS — R430 Anosmia: Secondary | ICD-10-CM | POA: Diagnosis not present
